# Patient Record
Sex: Female | Born: 1970 | Race: White | Hispanic: No | Marital: Married
Health system: Southern US, Community
[De-identification: ages and names within clinical notes are randomized; demographics above are authoritative.]

## PROBLEM LIST (undated history)

## (undated) DIAGNOSIS — E039 Hypothyroidism, unspecified: Secondary | ICD-10-CM

## (undated) DIAGNOSIS — I951 Orthostatic hypotension: Secondary | ICD-10-CM

## (undated) DIAGNOSIS — D649 Anemia, unspecified: Secondary | ICD-10-CM

## (undated) DIAGNOSIS — E669 Obesity, unspecified: Secondary | ICD-10-CM

## (undated) DIAGNOSIS — F603 Borderline personality disorder: Secondary | ICD-10-CM

## (undated) DIAGNOSIS — E7800 Pure hypercholesterolemia, unspecified: Secondary | ICD-10-CM

## (undated) DIAGNOSIS — I2510 Atherosclerotic heart disease of native coronary artery without angina pectoris: Secondary | ICD-10-CM

## (undated) DIAGNOSIS — F4310 Post-traumatic stress disorder, unspecified: Secondary | ICD-10-CM

## (undated) DIAGNOSIS — E785 Hyperlipidemia, unspecified: Secondary | ICD-10-CM

## (undated) DIAGNOSIS — Q2112 Patent foramen ovale: Secondary | ICD-10-CM

## (undated) DIAGNOSIS — I639 Cerebral infarction, unspecified: Secondary | ICD-10-CM

## (undated) DIAGNOSIS — I10 Essential (primary) hypertension: Secondary | ICD-10-CM

## (undated) DIAGNOSIS — I252 Old myocardial infarction: Secondary | ICD-10-CM

## (undated) DIAGNOSIS — Q211 Atrial septal defect: Secondary | ICD-10-CM

## (undated) DIAGNOSIS — I219 Acute myocardial infarction, unspecified: Secondary | ICD-10-CM

## (undated) DIAGNOSIS — M79605 Pain in left leg: Secondary | ICD-10-CM

## (undated) DIAGNOSIS — R200 Anesthesia of skin: Secondary | ICD-10-CM

## (undated) DIAGNOSIS — F332 Major depressive disorder, recurrent severe without psychotic features: Secondary | ICD-10-CM

## (undated) HISTORY — DX: Essential (primary) hypertension: I10

## (undated) HISTORY — PX: CARDIAC CATHETERIZATION: SHX172

## (undated) HISTORY — DX: Atrial septal defect: Q21.1

## (undated) HISTORY — DX: Anemia, unspecified: D64.9

## (undated) HISTORY — DX: Patent foramen ovale: Q21.12

## (undated) HISTORY — DX: Anesthesia of skin: R20.0

## (undated) HISTORY — DX: Cerebral infarction, unspecified: I63.9

## (undated) HISTORY — DX: Orthostatic hypotension: I95.1

## (undated) HISTORY — DX: Borderline personality disorder: F60.3

## (undated) HISTORY — PX: TONSILLECTOMY: SUR1361

## (undated) HISTORY — DX: Old myocardial infarction: I25.2

## (undated) HISTORY — DX: Atherosclerotic heart disease of native coronary artery without angina pectoris: I25.10

## (undated) HISTORY — PX: ANTERIOR CRUCIATE LIGAMENT REPAIR: SHX115

## (undated) HISTORY — DX: Pain in left leg: M79.605

## (undated) HISTORY — DX: Post-traumatic stress disorder, unspecified: F43.10

## (undated) HISTORY — DX: Acute myocardial infarction, unspecified: I21.9

## (undated) HISTORY — PX: ABDOMINAL HYSTERECTOMY: SHX81

## (undated) HISTORY — DX: Obesity, unspecified: E66.9

## (undated) HISTORY — PX: CORONARY ANGIOPLASTY WITH STENT PLACEMENT: SHX49

## (undated) HISTORY — DX: Hyperlipidemia, unspecified: E78.5

## (undated) HISTORY — DX: Hypothyroidism, unspecified: E03.9

## (undated) HISTORY — DX: Major depressive disorder, recurrent severe without psychotic features: F33.2

## (undated) HISTORY — PX: DILATION AND CURETTAGE OF UTERUS: SHX78

## (undated) HISTORY — DX: Pure hypercholesterolemia, unspecified: E78.00

---

## 2004-12-20 ENCOUNTER — Ambulatory Visit: Payer: Self-pay | Admitting: Cardiovascular Disease

## 2004-12-20 ENCOUNTER — Inpatient Hospital Stay (HOSPITAL_COMMUNITY): Admission: AD | Admit: 2004-12-20 | Discharge: 2004-12-22 | Payer: Self-pay | Admitting: Cardiology

## 2005-05-29 ENCOUNTER — Emergency Department (HOSPITAL_COMMUNITY): Admission: EM | Admit: 2005-05-29 | Discharge: 2005-05-30 | Payer: Self-pay | Admitting: Emergency Medicine

## 2008-11-08 ENCOUNTER — Emergency Department (HOSPITAL_COMMUNITY): Admission: EM | Admit: 2008-11-08 | Discharge: 2008-11-08 | Payer: Self-pay | Admitting: Emergency Medicine

## 2010-04-04 ENCOUNTER — Emergency Department (HOSPITAL_COMMUNITY): Admission: EM | Admit: 2010-04-04 | Discharge: 2010-04-04 | Payer: Self-pay | Admitting: Emergency Medicine

## 2015-07-22 DIAGNOSIS — I252 Old myocardial infarction: Secondary | ICD-10-CM

## 2015-07-22 DIAGNOSIS — I10 Essential (primary) hypertension: Secondary | ICD-10-CM | POA: Insufficient documentation

## 2015-07-22 DIAGNOSIS — I951 Orthostatic hypotension: Secondary | ICD-10-CM

## 2015-07-22 DIAGNOSIS — E785 Hyperlipidemia, unspecified: Secondary | ICD-10-CM

## 2015-07-22 HISTORY — DX: Old myocardial infarction: I25.2

## 2015-07-22 HISTORY — DX: Hyperlipidemia, unspecified: E78.5

## 2015-07-22 HISTORY — DX: Orthostatic hypotension: I95.1

## 2016-07-09 DIAGNOSIS — F4310 Post-traumatic stress disorder, unspecified: Secondary | ICD-10-CM

## 2016-07-09 DIAGNOSIS — F603 Borderline personality disorder: Secondary | ICD-10-CM | POA: Insufficient documentation

## 2016-07-09 HISTORY — DX: Post-traumatic stress disorder, unspecified: F43.10

## 2016-07-09 HISTORY — DX: Borderline personality disorder: F60.3

## 2018-01-22 DIAGNOSIS — I639 Cerebral infarction, unspecified: Secondary | ICD-10-CM | POA: Insufficient documentation

## 2018-01-31 DIAGNOSIS — I639 Cerebral infarction, unspecified: Secondary | ICD-10-CM

## 2018-01-31 HISTORY — DX: Cerebral infarction, unspecified: I63.9

## 2018-02-18 ENCOUNTER — Ambulatory Visit: Payer: Self-pay | Admitting: Occupational Therapy

## 2018-02-18 ENCOUNTER — Ambulatory Visit: Payer: Self-pay | Admitting: Physical Therapy

## 2018-03-02 ENCOUNTER — Ambulatory Visit: Payer: Medicaid Other | Attending: Physical Medicine and Rehabilitation | Admitting: Physical Therapy

## 2018-03-02 ENCOUNTER — Ambulatory Visit: Payer: Medicaid Other | Admitting: Occupational Therapy

## 2018-09-22 ENCOUNTER — Ambulatory Visit: Payer: Medicaid Other | Admitting: Neurology

## 2018-11-02 ENCOUNTER — Encounter: Payer: Self-pay | Admitting: Neurology

## 2018-11-02 ENCOUNTER — Ambulatory Visit: Payer: Medicaid Other | Admitting: Neurology

## 2018-11-02 VITALS — BP 118/80 | HR 70 | Ht 68.5 in | Wt 219.5 lb

## 2018-11-02 DIAGNOSIS — R200 Anesthesia of skin: Secondary | ICD-10-CM

## 2018-11-02 DIAGNOSIS — M79605 Pain in left leg: Secondary | ICD-10-CM

## 2018-11-02 HISTORY — DX: Pain in left leg: M79.605

## 2018-11-02 HISTORY — DX: Anesthesia of skin: R20.0

## 2018-11-02 MED ORDER — GABAPENTIN 100 MG PO CAPS: 100.0000 mg | ORAL_CAPSULE | Freq: Three times a day (TID) | ORAL | 6 refills | Status: DC

## 2018-11-02 NOTE — Progress Notes (Signed)
PATIENT: Jennifer Ali DOB: December 07, 1971  Chief Complaint  Patient presents with  . New Patient (Initial Visit)    New room, alone.  Paper referral from Dr. Clarene Duke for hx stroke. Pt had a stroke 6 months ago and still c/o post stroke phantom leg pains. She was seen at [facility] for stroke 01/2018. This was her first stroke. Having walking difficulty, using quad cane. States she has decreased function in left leg. Reprts about 2-3 falls in last year but no significant injuries.  She wears AFO brace (walk on reaction) on left foot.   Marland Kitchen PCP    Aida Puffer, MD  . Pain     HISTORICAL  Jennifer Ali is a 47 year old female, seen in request by primary care physician Dr. Aida Puffer for evaluation of stroke, initial evaluation was on November 02, 2018.  I have reviewed and summarized the referring note from the referring physician.  She reported a history of hypothyroidism, on supplement, hypertension, coronary artery disease in the past,  In February 2019, she woke up noticed left leg weakness, gradually getting worse over the next few days, to the point of difficulty walking, also clumsiness of her left hand,  She was treated at [facility] under the last name Jennifer, Ali, was diagnosed with stroke, this was followed by rehabilitation, and the left AFO, she came in today with fourfoot cane, wearing left AFO,  she continue complains of gait abnormality,  left low back pain, radiating pain to left lower extremity, frequent left leg muscle spasm, has tried over-the-counter Tylenol ibuprofen without helping,  I reviewed the history from [facility], MRI of the brain in February 2019: Acute right frontal parietal infarction, severe chronic microvascular ischemic changes with remote lacunar infarction.  Multiple foci of some semi confluent of restricted diffusion in the superior right frontal and parietal lobe with associated leptomeningeal enhancement and increased T2/FLAIR  signal. There is severe periventricular deep white matter increased T2-FLAIR signal consistent with chronic microvascular ischemic changes. Several old centrum semiovale lacunar infarct. Vessel wall imaging was performed and no vessel wall enhancement is noted. There are no extra-axial fluid collections present.  Intracranial MRA does not demonstrate any aneurysms or high grade stenoses.  Ultrasound of carotid artery: Left internal carotid artery less than 40% stenosis, right internal carotid artery was normal, subclavian's was normal, both vertebrals were patent with anterograde flow.   MRI of cervical spine: Degenerative disc disease results in severe left neural foraminal narrowing at C4-5 and mild canal stenosis at C5-6.  Laboratory evaluations hemoglobin 12.8, normal free T4 was within normal limits 1.25, A1c 5.3, triglyceride 227, LDL 52.  REVIEW OF SYSTEMS: Full 14 system review of systems performed and notable only for as above. All other review of systems were negative.  ALLERGIES: Allergies  Allergen Reactions  . Sulfa Antibiotics Hives    HOME MEDICATIONS: Current Outpatient Medications  Medication Sig Dispense Refill  . ASPIRIN 81 PO Take 1 tablet by mouth daily.    . chlorthalidone (HYGROTON) 25 MG tablet Take 1 tablet by mouth daily.  3  . levothyroxine (SYNTHROID, LEVOTHROID) 200 MCG tablet Take 1 tablet by mouth daily.  11  . metoprolol succinate (TOPROL-XL) 100 MG 24 hr tablet Take 1 tablet by mouth daily.  3   No current facility-administered medications for this visit.     PAST MEDICAL HISTORY: Past Medical History:  Diagnosis Date  . Heart attack (HCC)   . High cholesterol   . Hypertension   .  Stroke [facility]) 01/2018    PAST SURGICAL HISTORY: The histories are not reviewed yet. Please review them in the "History" navigator section and refresh this SmartLink.  FAMILY HISTORY: No family history on file.  SOCIAL HISTORY: Social History   Socioeconomic  History  . Marital status: Married    Spouse name: Not on file  . Number of children: Not on file  . Years of education: Not on file  . Highest education level: Not on file  Occupational History  . Not on file  Social Needs  . Financial resource strain: Not on file  . Food insecurity:    Worry: Not on file    Inability: Not on file  . Transportation needs:    Medical: Not on file    Non-medical: Not on file  Tobacco Use  . Smoking status: Not on file  Substance and Sexual Activity  . Alcohol use: Not on file  . Drug use: Not on file  . Sexual activity: Not on file  Lifestyle  . Physical activity:    Days per week: Not on file    Minutes per session: Not on file  . Stress: Not on file  Relationships  . Social connections:    Talks on phone: Not on file    Gets together: Not on file    Attends religious service: Not on file    Active member of club or organization: Not on file    Attends meetings of clubs or organizations: Not on file    Relationship status: Not on file  . Intimate partner violence:    Fear of current or ex partner: Not on file    Emotionally abused: Not on file    Physically abused: Not on file    Forced sexual activity: Not on file  Other Topics Concern  . Not on file  Social History Narrative   Lives with mother, father and fmaily   Caffeine use: soda/tea daily   Right handed     PHYSICAL EXAM   Vitals:   11/02/18 0742  BP: 118/80  Pulse: 70  Weight: 219 lb 8 oz (99.6 kg)  Height: 5' 8.5" (1.74 m)    Not recorded      Body mass index is 32.89 kg/m.  PHYSICAL EXAMNIATION:  Gen: NAD, conversant, well nourised, obese, well groomed                     Cardiovascular: Regular rate rhythm, no peripheral edema, warm, nontender. Eyes: Conjunctivae clear without exudates or hemorrhage Neck: Supple, no carotid bruits. Pulmonary: Clear to auscultation bilaterally   NEUROLOGICAL EXAM:  MENTAL STATUS: Speech:    Speech is normal;  fluent and spontaneous with normal comprehension.  Cognition:     Orientation to time, place and person     Normal recent and remote memory     Normal Attention span and concentration     Normal Language, naming, repeating,spontaneous speech     Fund of knowledge   CRANIAL NERVES: CN II: Visual fields are full to confrontation. Fundoscopic exam is normal with sharp discs and no vascular changes. Pupils are round equal and briskly reactive to light. CN III, IV, VI: extraocular movement are normal. No ptosis. CN V: Facial sensation is intact to pinprick in all 3 divisions bilaterally. Corneal responses are intact.  CN VII: Face is symmetric with normal eye closure and smile. CN VIII: Hearing is normal to rubbing fingers CN IX, X: Palate elevates symmetrically. Phonation  is normal. CN XI: Head turning and shoulder shrug are intact CN XII: Tongue is midline with normal movements and no atrophy.  MOTOR: She wears a left AFO, variable effort on examinations, there was no significant left ankle dorsiflexion weakness.  REFLEXES: Reflexes are 2+ and symmetric at the biceps, triceps, knees, and ankles. Plantar responses are flexor.  SENSORY: Intact to light touch, pinprick, positional sensation and vibratory sensation are intact in fingers and toes.  COORDINATION: Rapid alternating movements and fine finger movements are intact. There is no dysmetria on finger-to-nose and heel-knee-shin.    GAIT/STANCE: She needs pushed up to get up from seated position, variable effort,  DIAGNOSTIC DATA (LABS, IMAGING, TESTING) - I reviewed patient records, labs, notes, testing and imaging myself where available.   ASSESSMENT AND PLAN  Maryelizabeth Eberle is a 47 y.o. female   Stroke in young patient  No significant vascular risk factors, normal blood pressure today, normal A1c 5.3, LDL was 52  Complete stroke evaluation with echocardiogram  May consider hypercoagulable, inflammatory work-up  Left  leg pain, muscle spasm  EMG nerve conduction study to rule out peripheral etiology  Gabapentin 100 mg 3 times a day   Levert Feinstein, M.D. Ph.D.  [facility] [address], [address] [place], [place] [address] Ph: [phone] Fax: [phone]  [id number], Fayrene Fearing, MD

## 2018-11-28 ENCOUNTER — Emergency Department (HOSPITAL_COMMUNITY)
Admission: EM | Admit: 2018-11-28 | Discharge: 2018-11-28 | Disposition: A | Discharge status: Home / Self Care | Payer: Medicaid Other | Attending: Emergency Medicine | Admitting: Emergency Medicine

## 2018-11-28 ENCOUNTER — Encounter (HOSPITAL_COMMUNITY): Payer: Self-pay

## 2018-11-28 ENCOUNTER — Emergency Department (HOSPITAL_COMMUNITY): Payer: Medicaid Other

## 2018-11-28 DIAGNOSIS — I10 Essential (primary) hypertension: Secondary | ICD-10-CM | POA: Diagnosis not present

## 2018-11-28 DIAGNOSIS — M62830 Muscle spasm of back: Secondary | ICD-10-CM | POA: Diagnosis not present

## 2018-11-28 DIAGNOSIS — Z79899 Other long term (current) drug therapy: Secondary | ICD-10-CM | POA: Insufficient documentation

## 2018-11-28 DIAGNOSIS — Z8673 Personal history of transient ischemic attack (TIA), and cerebral infarction without residual deficits: Secondary | ICD-10-CM | POA: Insufficient documentation

## 2018-11-28 DIAGNOSIS — M546 Pain in thoracic spine: Secondary | ICD-10-CM | POA: Insufficient documentation

## 2018-11-28 DIAGNOSIS — I252 Old myocardial infarction: Secondary | ICD-10-CM | POA: Diagnosis not present

## 2018-11-28 DIAGNOSIS — M7918 Myalgia, other site: Secondary | ICD-10-CM

## 2018-11-28 LAB — BASIC METABOLIC PANEL
Anion gap: 14 (ref 5–15)
BUN: 11 mg/dL (ref 6–20)
CO2: 25 mmol/L (ref 22–32)
CREATININE: 0.76 mg/dL (ref 0.44–1.00)
Calcium: 9.8 mg/dL (ref 8.9–10.3)
Chloride: 99 mmol/L (ref 98–111)
GFR calc Af Amer: >60 mL/min (ref >60)
GFR calc non Af Amer: >60 mL/min (ref >60)
Glucose, Bld: 137 mg/dL — ABNORMAL HIGH (ref 70–99)
Potassium: 3.6 mmol/L (ref 3.5–5.1)
Sodium: 138 mmol/L (ref 135–145)

## 2018-11-28 LAB — I-STAT TROPONIN, ED: Troponin i, poc: 0 ng/mL (ref 0.00–0.08)

## 2018-11-28 LAB — CBC WITH DIFFERENTIAL/PLATELET
Abs Immature Granulocytes: 0.04 10*3/uL (ref 0.00–0.07)
Basophils Absolute: 0 10*3/uL (ref 0.0–0.1)
Basophils Relative: 0 %
Eosinophils Absolute: 0.2 10*3/uL (ref 0.0–0.5)
Eosinophils Relative: 2 %
HCT: 44.4 % (ref 36.0–46.0)
Hemoglobin: 14.6 g/dL (ref 12.0–15.0)
Immature Granulocytes: 0 %
Lymphocytes Relative: 18 %
Lymphs Abs: 1.7 10*3/uL (ref 0.7–4.0)
MCH: 32.4 pg (ref 26.0–34.0)
MCHC: 32.9 g/dL (ref 30.0–36.0)
MCV: 98.7 fL (ref 80.0–100.0)
Monocytes Absolute: 0.4 10*3/uL (ref 0.1–1.0)
Monocytes Relative: 5 %
Neutro Abs: 7.1 10*3/uL (ref 1.7–7.7)
Neutrophils Relative %: 75 %
Platelets: 331 10*3/uL (ref 150–400)
RBC: 4.5 MIL/uL (ref 3.87–5.11)
RDW: 13.8 % (ref 11.5–15.5)
WBC: 9.5 10*3/uL (ref 4.0–10.5)
nRBC: 0 % (ref 0.0–0.2)

## 2018-11-28 NOTE — ED Provider Notes (Signed)
[facility] EMERGENCY DEPARTMENT Provider Note   CSN: [id number] Arrival date & time: 11/28/18  1806     History   Chief Complaint No chief complaint on file.   HPI Jennifer Ali is a 47 y.o. female.  47yo female with prior history of stroke and MI presents with complaint of pain in her left upper back x3 days.  Patient states pain started Thursday night while she was doing the dishes, has been constant since that time, is worse with movement and taking a deep breath, does not radiate, also reports discomfort in her left deltoid area, worse with movement of her left shoulder.  Patient denies shortness of breath, chest pain, nausea or vomiting, diaphoresis, weakness or change in sensation.  Patient states that she had a stroke in February of this year and reports she had similar pain in her back for 1 month leading up to her stroke and is concerned she is having another stroke.  No other complaints or concerns.     Past Medical History:  Diagnosis Date  . Heart attack (HCC)   . High cholesterol   . Hypertension   . Stroke [facility]) 01/2018    Patient Active Problem List   Diagnosis Date Noted  . Left leg pain 11/02/2018  . Left leg numbness 11/02/2018    History reviewed. No pertinent surgical history.   OB History   None      Home Medications    Prior to Admission medications   Medication Sig Start Date End Date Taking? Authorizing Provider  ASPIRIN 81 PO Take 1 tablet by mouth daily.    [provider]  chlorthalidone (HYGROTON) 25 MG tablet Take 1 tablet by mouth daily. 10/23/18   [provider]  gabapentin (NEURONTIN) 100 MG capsule Take 1 capsule (100 mg total) by mouth 3 (three) times daily. 11/02/18   Levert Feinstein, MD  levothyroxine (SYNTHROID, LEVOTHROID) 200 MCG tablet Take 1 tablet by mouth daily. 10/23/18   [provider]  metoprolol succinate (TOPROL-XL) 100 MG 24 hr tablet Take 1 tablet by mouth daily.  09/23/18   [provider]    Family History History reviewed. No pertinent family history.  Social History Social History   Tobacco Use  . Smoking status: Never Smoker  . Smokeless tobacco: Never Used  Substance Use Topics  . Alcohol use: Not on file  . Drug use: Not on file     Allergies   Sulfa antibiotics   Review of Systems Review of Systems  Constitutional: Negative for chills, diaphoresis, fatigue and fever.  Eyes: Negative for visual disturbance.  Respiratory: Negative for chest tightness and shortness of breath.   Cardiovascular: Negative for chest pain.  Gastrointestinal: Negative for abdominal pain, nausea and vomiting.  Musculoskeletal: Positive for back pain and myalgias. Negative for arthralgias, gait problem and joint swelling.  Skin: Negative for rash and wound.  Allergic/Immunologic: Negative for immunocompromised state.  Neurological: Negative for dizziness, facial asymmetry, speech difficulty, weakness, numbness and headaches.  Hematological: Does not bruise/bleed easily.  Psychiatric/Behavioral: Negative for confusion.  All other systems reviewed and are negative.    Physical Exam Updated Vital Signs BP (!) 129/97 (BP Location: Right Arm)   Pulse 86   Temp 98.3 F (36.8 C)   Resp 16   Ht 5\' 8"  (1.727 m)   Wt 95.3 kg   SpO2 98%   BMI 31.93 kg/m   Physical Exam  Constitutional: She is oriented to person, place, and time.  She appears well-developed and well-nourished. No distress.  HENT:  Head: Normocephalic and atraumatic.  Nose: Nose normal.  Mouth/Throat: Oropharynx is clear and moist. No oropharyngeal exudate.  Eyes: Pupils are equal, round, and reactive to light. EOM are normal.  Neck: Neck supple.  Cardiovascular: Normal rate, regular rhythm, normal heart sounds and intact distal pulses.  No murmur heard. Pulmonary/Chest: Effort normal and breath sounds normal. No respiratory distress.  Abdominal: Soft. She exhibits no  distension. There is no tenderness.  Musculoskeletal: She exhibits tenderness. She exhibits no deformity.       Cervical back: She exhibits no bony tenderness.       Thoracic back: She exhibits no bony tenderness.       Back:  And has palpation along the medial border of the left scapula with palpable muscle spasm, palpation clearly reproduces patient's pain.  There is no midline or bony tenderness.  There is no tenderness in the neck.  Mild tenderness to the left trapezius area and lateral left shoulder at the deltoid.  Neurological: She is alert and oriented to person, place, and time. She has normal strength and normal reflexes. She is not disoriented. She displays normal reflexes. No cranial nerve deficit or sensory deficit. GCS eye subscore is 4. GCS verbal subscore is 5. GCS motor subscore is 6. She displays no Babinski's sign on the right side. She displays no Babinski's sign on the left side.  Skin: Skin is warm and dry. No rash noted. She is not diaphoretic. No erythema.  Psychiatric: She has a normal mood and affect. Her behavior is normal.  Nursing note and vitals reviewed.    ED Treatments / Results  Labs (all labs ordered are listed, but only abnormal results are displayed) Labs Reviewed  BASIC METABOLIC PANEL - Abnormal; Notable for the following components:      Result Value   Glucose, Bld 137 (*)    All other components within normal limits  CBC WITH DIFFERENTIAL/PLATELET  I-STAT TROPONIN, ED    EKG EKG Interpretation  Date/Time:  Sunday November 28 2018 18:35:20 EST Ventricular Rate:  105 PR Interval:  144 QRS Duration: 94 QT Interval:  338 QTC Calculation: 446 R Axis:   72 Text Interpretation:  Sinus tachycardia Otherwise normal ECG No old tracing to compare Confirmed by Linwood Dibbles [phone]) on 11/28/2018 7:00:19 PM   Radiology Mr Brain Wo Contrast  Result Date: 11/28/2018 CLINICAL DATA:  47 y/o F; left shoulder and arm pain that started Thursday. History of  stroke in February. EXAM: MRI HEAD WITHOUT CONTRAST TECHNIQUE: Multiplanar, multiecho pulse sequences of the brain and surrounding structures were obtained without intravenous contrast. COMPARISON:  None. FINDINGS: Brain: No acute infarction, hemorrhage, hydrocephalus, extra-axial collection or mass lesion. Small chronic infarct of the right superior frontoparietal junction with mild wallerian degeneration extending into the right posterior limb of internal capsule. Several nonspecific T2 FLAIR hyperintensities in subcortical and periventricular white matter are advanced for age. Mild volume loss of the brain. No lesion is identified within the corpus callosum, basal ganglia, or the posterior fossa. Vascular: Normal flow voids. Skull and upper cervical spine: Normal marrow signal. Sinuses/Orbits: Partial opacification of left mastoid tip. No other abnormal signal of paranasal sinuses or mastoid air cells. Other: None. IMPRESSION: 1. No acute intracranial abnormality identified. 2. Small chronic infarct of the right superior frontoparietal junction. 3. Nonspecific white matter hyperintensities and volume loss of the brain unexpected for age. Some differential considerations include age advanced microvascular ischemic changes, vasculopathy,  demyelination, or sequelae of an infectious/inflammatory process. Electronically Signed   By: Mitzi Hansen M.D.   On: 11/28/2018 21:47   Dg Chest Port 1 View  Result Date: 11/28/2018 CLINICAL DATA:  Left shoulder pain EXAM: PORTABLE CHEST 1 VIEW COMPARISON:  None. FINDINGS: Lungs are clear.  No pleural effusion or pneumothorax. The heart is normal in size.  Coronary stent. IMPRESSION: No evidence of acute cardiopulmonary disease. Electronically Signed   By: Charline Bills M.D.   On: 11/28/2018 19:32    Procedures Procedures (including critical care time)  Medications Ordered in ED Medications - No data to display   Initial Impression / Assessment and  Plan / ED Course  I have reviewed the triage vital signs and the nursing notes.  Pertinent labs & imaging results that were available during my care of the patient were reviewed by me and considered in my medical decision making (see chart for details).  Clinical Course as of Nov 28 2202  Wynelle Link Nov 28, 2018  5868 47 year old female with history of stroke and MI presents with complaint of pain in her left upper back along the medial border the scapula and into her left shoulder.  Patient states that she had this exact pain for 1 month prior to her stroke and is concerned that she is having a stroke tonight.  Patient denies any new weakness or changes in sensation.  Patient denies changes in vision, speech, gait.  On exam patient has pain along the medial border of her left scapula which is reproduced with palpation, her exam is otherwise unremarkable.  Discussed with Dr. Lynelle Doctor, ER attending plan is for MRI brain.  MRI brain does not show any acute changes.  EKG is unremarkable, troponin x1 is negative, BMP and CBC are within normal notes.  Those results with patient who is relieved and reassured and will follow up with her primary care provider.   [LM]    Clinical Course User Index [LM] Jeannie Fend, PA-C   Final Clinical Impressions(s) / ED Diagnoses   Final diagnoses:  Musculoskeletal pain    ED Discharge Orders    None       Alden Hipp 11/28/18 2203    Linwood Dibbles, MD 11/30/18 1106

## 2018-11-28 NOTE — Discharge Instructions (Addendum)
Your evaluation today including MRI of your brain is reassuring, there is no evidence of an acute stroke at this time.  Apply over-the-counter lidocaine patches to your back for pain. Warm compresses to back for 20 meds at a time. Follow-up with your primary care provider for recheck.  Return to ER for new or worsening symptoms.

## 2018-11-28 NOTE — ED Triage Notes (Addendum)
Pt. Reports left shoulder and arm pain that started Thursday. Pt. Does not report injury to shoulder. Pt. Reports stroke in February with similar symptoms. Pt. Reports less sensation on left side from prior stroke.  A/O X4

## 2018-11-28 NOTE — ED Notes (Addendum)
Pt. Ambulated to bathroom without help, with steady gait

## 2018-11-28 NOTE — ED Notes (Signed)
Patient verbalizes understanding of discharge instructions. Opportunity for questioning and answers were provided. Armband removed by staff, pt discharged from ED ambulatory.   

## 2018-12-03 ENCOUNTER — Encounter: Payer: Medicaid Other | Admitting: Neurology

## 2019-01-23 NOTE — Progress Notes (Signed)
Cardiology Office Note:    Date:  01/25/2019   ID:  Jennifer Ali, DOB [birth year], MRN [id number]  PCP:  Tamsen Roers, MD  Cardiologist:  Shirlee More, MD   Referring MD: Tamsen Roers, MD  ASSESSMENT:    1. CAD in native artery   2. Essential hypertension   3. Ischemic stroke ([place])   4. Old MI (myocardial infarction)   5. Dyslipidemia    PLAN:    In order of problems listed above:  1. Stable at this time I do not think she requires an ischemia evaluation should continue current treatment but also start lipid-lowering therapy as a matter loss Y woman with CAD and stroke he has not receiving it and will start with a high intensity statin. 2. Stable blood pressure target continue current treatment recheck renal function potassium 3. For further evaluation 2-week ZIO monitor to screen for atrial fibrillation and echocardiogram with bubble to screen for cardiac source of embolism 4. See discussion under CAD 5. Initiate lipid-lowering therapy with stroke and heart attack  Next appointment   Medication Adjustments/Labs and Tests Ordered: Current medicines are reviewed at length with the patient today.  Concerns regarding medicines are outlined above.  Orders Placed This Encounter  Procedures  . Lipid Profile  . Lipoprotein A (LPA)  . Comp Met (CMET)  . LONG TERM MONITOR (3-14 DAYS)  . EKG 12-Lead  . ECHOCARDIOGRAM LIMITED BUBBLE STUDY   Meds ordered this encounter  Medications  . rosuvastatin (CRESTOR) 10 MG tablet    Sig: Take 1 tablet (10 mg total) by mouth daily.    Dispense:  30 tablet    Refill:  3     Chief Complaint  Patient presents with  . Establish Care    recent stroke  . Coronary Artery Disease    History of Present Illness:    Jennifer Ali is a 48 y.o. female who is being seen today for establishing cardiology care at the request of Little, Jeneen Rinks, MD.  She had a history of recent stroke 01/28/2018 her right frontal parietal  area on MRI outside of the window for intervention.  Carotid duplex showed a 40% stenosis of the left internal carotid artery.  She is treated medically with aspirin an echocardiogram was ordered but does not appear to have been performed and she was monitored in hospital and no arrhythmia was documented.  She has a background history of recent methamphetamine usage and a history of coronary artery disease.  And lipid profile showed a cholesterol 130 HDL 33 LDL of 52 triglyceride 227 unknown whether is fasting or not BMP was normal TSH was markedly elevated at 36.8 and CBC showed a hemoglobin of 12.8 macrocytic indices.  She was subsequently seen in November by neurology for stroke echocardiogram ordered she did not have it performed she presented to the emergency room [facility] 11/28/2018 with back pain it was felt to be non-cardiovascular in etiology repeat MRI of the brain did not show acute ischemic changes EKG showed sinus tachycardia otherwise normal troponin BMP and CBC were all noted is normal and was discharged from the emergency room with a final diagnosis of musculoskeletal pain.  I independently reviewed that EKG and agree with the exception of sinus tachycardia and is normal.    she has a history in 2005-2006 of myocardial infarction PCI and stent. I had seen her in the past. She has no chest pain palpitation shortness of breath or syncope and no known history  of congenital heart disease or atrial fibrillation.  During her stay with stroke at [facility] she was predominantly in a rehab center not monitored.  Echocardiogram is been discussed on several occasions but never performed.  For further evaluation we will do a lipid profile and lipoprotein a level in 1 month after starting a high intensity statin 2-week ambulatory monitor looking for subclinical atrial fibrillation and echocardiogram with bubble contrast and if abnormal would require transesophageal echocardiogram.  Should  continue her current lipid-lowering therapy.  At this time I do not think she requires an ischemia evaluation and her hypertension is stable and continue current treatment   Past Medical History:  Diagnosis Date  . Anemia   . Dyslipidemia 07/22/2015   Overview:  Low HDL  . Heart attack ([place])   . High cholesterol   . Hypertension   . Hypothyroidism   . Ischemic stroke ([place]) 01/31/2018  . Left leg numbness 11/02/2018  . Left leg pain 11/02/2018  . Obesity   . 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 s/p MI Dec. 2005. S/P PCI and CYpher stent LAD  2. No sig. disease by cath, June,2010  3.Stress echo negative for ischemia at 10 mets in June 2013  . Orthostatic hypotension 07/22/2015  . Stroke [facility]) 01/2018    Past Surgical History:  Procedure Laterality Date  . ABDOMINAL HYSTERECTOMY    . ANTERIOR CRUCIATE LIGAMENT REPAIR    . CARDIAC CATHETERIZATION    . CORONARY ANGIOPLASTY WITH STENT PLACEMENT    . DILATION AND CURETTAGE OF UTERUS    . TONSILLECTOMY      Current Medications: Current Meds  Medication Sig  . ASPIRIN 81 PO Take 1 tablet by mouth daily.  . chlorthalidone (HYGROTON) 25 MG tablet Take 1 tablet by mouth daily.  Marland Kitchen gabapentin (NEURONTIN) 100 MG capsule Take 1 capsule (100 mg total) by mouth 3 (three) times daily.  Marland Kitchen levothyroxine (SYNTHROID, LEVOTHROID) 200 MCG tablet Take 1 tablet by mouth daily.  . metoprolol succinate (TOPROL-XL) 100 MG 24 hr tablet Take 1 tablet by mouth daily.     Allergies:   Sulfa antibiotics   Social History   Socioeconomic History  . Marital status: Married    Spouse name: Not on file  . Number of children: Not on file  . Years of education: Not on file  . Highest education level: Not on file  Occupational History  . Not on file  Social Needs  . Financial resource strain: Not on file  . Food insecurity:    Worry: Not on file    Inability: Not on file  . Transportation needs:    Medical: Not on file     Non-medical: Not on file  Tobacco Use  . Smoking status: Former Smoker    Packs/day: 0.50    Types: Cigarettes  . Smokeless tobacco: Never Used  Substance and Sexual Activity  . Alcohol use: Not Currently    Comment: occassionally  . Drug use: Not Currently    Types: Marijuana, Methamphetamines, Cocaine  . Sexual activity: Not on file  Lifestyle  . Physical activity:    Days per week: Not on file    Minutes per session: Not on file  . Stress: Not on file  Relationships  . Social connections:    Talks on phone: Not on file    Gets together: Not on file    Attends religious service: Not on file    Active member of  club or organization: Not on file    Attends meetings of clubs or organizations: Not on file    Relationship status: Not on file  Other Topics Concern  . Not on file  Social History Narrative   Lives with mother, father and fmaily   Caffeine use: soda/tea daily   Right handed     Family History: The patient's family history includes Diabetes in her father and mother; Hyperlipidemia in her mother; Hypertension in her mother; Stroke in her mother.  ROS:   Review of Systems  Constitution: Positive for malaise/fatigue.  HENT: Negative.   Eyes: Negative.   Cardiovascular: Negative.   Respiratory: Negative.   Endocrine: Negative.   Hematologic/Lymphatic: Negative.   Skin: Negative.   Musculoskeletal: Positive for muscle weakness (left leg).  Gastrointestinal: Negative.   Genitourinary: Negative.   Neurological: Positive for focal weakness.  Psychiatric/Behavioral: Negative.   Allergic/Immunologic: Negative.    Please see the history of present illness.     All other systems reviewed and are negative.  EKGs/Labs/Other Studies Reviewed:    The following studies were reviewed today: I reviewed her records both neurology consultation after and her records from inpatient with stroke prior to the visit  EKG:  EKG is  ordered today.  The ekg ordered today  demonstrates [place] normal  Recent Labs: 11/28/2018: BUN 11; Creatinine, Ser 0.76; Hemoglobin 14.6; Platelets 331; Potassium 3.6; Sodium 138  Recent Lipid Panel No results found for: CHOL, TRIG, HDL, CHOLHDL, VLDL, LDLCALC, LDLDIRECT  Physical Exam:    VS:  BP 110/80 (BP Location: Left Arm, Patient Position: Sitting, Cuff Size: Large)   Pulse 78   Ht 5' 8.5" (1.74 m)   Wt 223 lb 6 oz (101.3 kg)   SpO2 96%   BMI 33.47 kg/m     Wt Readings from Last 3 Encounters:  01/25/19 223 lb 6 oz (101.3 kg)  11/28/18 210 lb (95.3 kg)  11/02/18 219 lb 8 oz (99.6 kg)     GEN:  Well nourished, well developed in no acute distress HEENT: Normal NECK: No JVD; No carotid bruits LYMPHATICS: No lymphadenopathy CARDIAC: RRR, no murmurs, rubs, gallops RESPIRATORY:  Clear to auscultation without rales, wheezing or rhonchi  ABDOMEN: Soft, non-tender, non-distended MUSCULOSKELETAL:  No edema; No deformity  SKIN: Warm and dry NEUROLOGIC:  Alert and oriented x 3 PSYCHIATRIC:  Normal affect     Signed, Shirlee More, MD  01/25/2019 12:52 PM    [facility]

## 2019-01-25 ENCOUNTER — Other Ambulatory Visit: Payer: Self-pay | Admitting: Cardiology

## 2019-01-25 ENCOUNTER — Encounter: Payer: Self-pay | Admitting: Cardiology

## 2019-01-25 ENCOUNTER — Ambulatory Visit (INDEPENDENT_AMBULATORY_CARE_PROVIDER_SITE_OTHER): Payer: Medicaid Other | Admitting: Cardiology

## 2019-01-25 ENCOUNTER — Encounter: Payer: Self-pay | Admitting: *Deleted

## 2019-01-25 VITALS — BP 110/80 | HR 78 | Ht 68.5 in | Wt 223.4 lb

## 2019-01-25 DIAGNOSIS — I10 Essential (primary) hypertension: Secondary | ICD-10-CM

## 2019-01-25 DIAGNOSIS — I639 Cerebral infarction, unspecified: Secondary | ICD-10-CM

## 2019-01-25 DIAGNOSIS — F332 Major depressive disorder, recurrent severe without psychotic features: Secondary | ICD-10-CM

## 2019-01-25 DIAGNOSIS — I2510 Atherosclerotic heart disease of native coronary artery without angina pectoris: Secondary | ICD-10-CM

## 2019-01-25 DIAGNOSIS — I252 Old myocardial infarction: Secondary | ICD-10-CM

## 2019-01-25 DIAGNOSIS — E785 Hyperlipidemia, unspecified: Secondary | ICD-10-CM

## 2019-01-25 DIAGNOSIS — R000 Tachycardia, unspecified: Secondary | ICD-10-CM

## 2019-01-25 HISTORY — DX: Major depressive disorder, recurrent severe without psychotic features: F33.2

## 2019-01-25 MED ORDER — ROSUVASTATIN CALCIUM 10 MG PO TABS: 10.0000 mg | ORAL_TABLET | Freq: Every day | ORAL | 3 refills | Status: DC

## 2019-01-25 NOTE — Patient Instructions (Signed)
Medication Instructions:  Your physician has recommended you make the following change in your medication:    START:  rosuvastatin (Crestor) 10mg  one tablet daily.   If you need a refill on your cardiac medications before your next appointment, please call your pharmacy.   Lab work: You will have lab work in 1 month.  CMP, Lipids, and Lpa  If you have labs (blood work) drawn today and your tests are completely normal, you will receive your results only by: Marland Kitchen MyChart Message (if you have MyChart) OR . A paper copy in the mail If you have any lab test that is abnormal or we need to change your treatment, we will call you to review the results.  Testing/Procedures: You had an EKG today  Your physician has recommended that you wear a holter monitor. Holter monitors are medical devices that record the heart's electrical activity. Doctors most often use these monitors to diagnose arrhythmias. Arrhythmias are problems with the speed or rhythm of the heartbeat. The monitor is a small, portable device. You can wear one while you do your normal daily activities. This is usually used to diagnose what is causing palpitations/syncope (passing out).  Your physician has requested that you have an echocardiogram. Echocardiography is a painless test that uses sound waves to create images of your heart. It provides your doctor with information about the size and shape of your heart and how well your heart's chambers and valves are working. This procedure takes approximately one hour. There are no restrictions for this procedure.    Follow-Up: At [facility], you and your health needs are our priority.  As part of our continuing mission to provide you with exceptional heart care, we have created designated Provider Care Teams.  These Care Teams include your primary Cardiologist (physician) and Advanced Practice Providers (APPs -  Physician Assistants and Nurse Practitioners) who all work together to  provide you with the care you need, when you need it. You will need a follow up appointment in 3 months.  Please call our office 2 months in advance to schedule this appointment.

## 2019-02-01 ENCOUNTER — Ambulatory Visit (INDEPENDENT_AMBULATORY_CARE_PROVIDER_SITE_OTHER): Payer: Medicaid Other

## 2019-02-01 DIAGNOSIS — R000 Tachycardia, unspecified: Secondary | ICD-10-CM | POA: Diagnosis not present

## 2019-02-01 DIAGNOSIS — I639 Cerebral infarction, unspecified: Secondary | ICD-10-CM

## 2019-02-09 ENCOUNTER — Telehealth: Payer: Self-pay | Admitting: Neurology

## 2019-02-09 NOTE — Telephone Encounter (Signed)
Pt called back to r/s a NCV/EMG that she had cancelled before but she is also wanting to know if she can get advise on what to do for her memory loss that she has been experiencing in the last month due to her stroke. Please advise.

## 2019-02-10 NOTE — Telephone Encounter (Signed)
Returned call to the patient.  She has rescheduled her NCV/EMG to 03/14/2019.  She would like to come back in for a visit to have her worsening memory evaluated.  She has been scheduled for an office visit on 03/21/2019.

## 2019-02-21 ENCOUNTER — Other Ambulatory Visit: Payer: Medicaid Other

## 2019-03-11 ENCOUNTER — Telehealth: Payer: Self-pay | Admitting: *Deleted

## 2019-03-11 NOTE — Telephone Encounter (Signed)
Called and spoke with pt. Advised 03/14/19 emg/ncs appt cx d/t concerns with coronavirus. We will f/u on Monday about getting her appt r/s. She verbalized understanding and appreciation for call.

## 2019-03-14 ENCOUNTER — Encounter: Payer: Self-pay | Admitting: Neurology

## 2019-03-14 ENCOUNTER — Other Ambulatory Visit: Payer: Medicaid Other

## 2019-03-18 ENCOUNTER — Encounter: Payer: Self-pay | Admitting: Neurology

## 2019-03-18 ENCOUNTER — Other Ambulatory Visit: Payer: Self-pay

## 2019-03-18 ENCOUNTER — Ambulatory Visit (INDEPENDENT_AMBULATORY_CARE_PROVIDER_SITE_OTHER): Payer: Medicaid Other | Admitting: Neurology

## 2019-03-18 DIAGNOSIS — M79605 Pain in left leg: Secondary | ICD-10-CM | POA: Diagnosis not present

## 2019-03-18 DIAGNOSIS — I639 Cerebral infarction, unspecified: Secondary | ICD-10-CM | POA: Diagnosis not present

## 2019-03-18 DIAGNOSIS — R413 Other amnesia: Secondary | ICD-10-CM | POA: Diagnosis not present

## 2019-03-18 MED ORDER — GABAPENTIN 300 MG PO CAPS: 300.0000 mg | ORAL_CAPSULE | Freq: Three times a day (TID) | ORAL | 11 refills | Status: DC

## 2019-03-18 NOTE — Progress Notes (Signed)
PATIENT: Jennifer Ali DOB: [birth year]  No chief complaint on file.    HISTORICAL  Jennifer Ali is a 48 year old female, seen in request by primary care physician Dr. Aida Puffer for evaluation of stroke, initial evaluation was on November 02, 2018.  I have reviewed and summarized the referring note from the referring physician.  She reported a history of hypothyroidism, on supplement, hypertension, coronary artery disease in the past,  In February 2019, she woke up noticed left leg weakness, gradually getting worse over the next few days, to the point of difficulty walking, also clumsiness of her left hand,  She was treated at [facility] under the last name R[place], [place], was diagnosed with stroke, this was followed by rehabilitation, and the left AFO, she came in today with fourfoot cane, wearing left AFO,  she continue complains of gait abnormality,  left low back pain, radiating pain to left lower extremity, frequent left leg muscle spasm, has tried over-the-counter Tylenol ibuprofen without helping,  I reviewed the history from [facility], MRI of the brain in February 2019: Acute right frontal parietal infarction, severe chronic microvascular ischemic changes with remote lacunar infarction.  Multiple foci of some semi confluent of restricted diffusion in the superior right frontal and parietal lobe with associated leptomeningeal enhancement and increased T2/FLAIR signal. There is severe periventricular deep white matter increased T2-FLAIR signal consistent with chronic microvascular ischemic changes. Several old centrum semiovale lacunar infarct. Vessel wall imaging was performed and no vessel wall enhancement is noted. There are no extra-axial fluid collections present.  Intracranial MRA does not demonstrate any aneurysms or high grade stenoses.  Ultrasound of carotid artery: Left internal carotid artery less than 40% stenosis, right internal carotid  artery was normal, subclavian's was normal, both vertebrals were patent with anterograde flow.   MRI of cervical spine: Degenerative disc disease results in severe left neural foraminal narrowing at C4-5 and mild canal stenosis at C5-6.  Laboratory evaluations hemoglobin 12.8, normal free T4 was within normal limits 1.25, A1c 5.3, triglyceride 227, LDL 52.  Virtual Visit via Video  I connected with Jennifer Ali on 03/18/19 at  by Video and verified that I am speaking with the correct person using two identifiers.   I discussed the limitations, risks, security and privacy concerns of performing an evaluation and management service by Video and the availability of in person appointments. I also discussed with the patient that there may be a patient responsible charge related to this service. The patient expressed understanding and agreed to proceed.   History of Present Illness: Her scheduled ECHO, EMG/NCS was cancelled. She still has left leg difficulty, frequent left leg cramping, also complains of short-term memory loss.  She is taking 100 mg 3 times a day, which was helpful,   Observations/Objective: I have reviewed problem lists, medications, allergies.  I personally reviewed MRI of the brain in December 2019, no acute abnormality, small chronic infarction of the right superior frontal parietal junction, mild generalized atrophy, supratentorium small vessel disease.   Assessment and Plan: Stroke in young patient  No significant vascular risk factors, normal blood pressure today, normal A1c 5.3, LDL was 52  Complete stroke evaluation with echocardiogram-pending  MRI brain showed generalized atrophy, supratentorium small vessel disease, unexpected for age, small chronic infarction of the right superior frontal parietal junction, consideration including advanced microvascular ischemic changes, vasculopathy, demyelination, Short-term memory loss  Laboratory evaluations, B12,  TSH, also inflammatory markers  Left leg pain, muscle  spasm  EMG nerve conduction study pending  Higher dose of gabapentin 300 mg 3 times daily   Follow Up Instructions:  In 2-3 months    I discussed the assessment and treatment plan with the patient. The patient was provided an opportunity to ask questions and all were answered. The patient agreed with the plan and demonstrated an understanding of the instructions.   The patient was advised to call back or seek an in-person evaluation if the symptoms worsen or if the condition fails to improve as anticipated.  I provided 25 minutes of non-face-to-face time during this encounter.   Levert Feinstein, MD

## 2019-03-21 ENCOUNTER — Ambulatory Visit: Payer: Self-pay | Admitting: Neurology

## 2019-06-01 ENCOUNTER — Other Ambulatory Visit: Payer: Self-pay

## 2019-06-01 ENCOUNTER — Telehealth: Payer: Self-pay | Admitting: Neurology

## 2019-06-01 ENCOUNTER — Ambulatory Visit (INDEPENDENT_AMBULATORY_CARE_PROVIDER_SITE_OTHER): Payer: Medicaid Other | Admitting: Neurology

## 2019-06-01 DIAGNOSIS — I639 Cerebral infarction, unspecified: Secondary | ICD-10-CM | POA: Diagnosis not present

## 2019-06-01 DIAGNOSIS — R269 Unspecified abnormalities of gait and mobility: Secondary | ICD-10-CM | POA: Diagnosis not present

## 2019-06-01 DIAGNOSIS — M79605 Pain in left leg: Secondary | ICD-10-CM

## 2019-06-01 DIAGNOSIS — R200 Anesthesia of skin: Secondary | ICD-10-CM

## 2019-06-01 NOTE — Progress Notes (Signed)
PATIENT: Jennifer Ali DOB: [birth year]  HISTORICAL  Jennifer Ali is a 48 year old female, seen in request by primary care physician Dr. Aida Puffer for evaluation of stroke, initial evaluation was on November 02, 2018.  I have reviewed and summarized the referring note from the referring physician.  She reported a history of hypothyroidism, on supplement, hypertension, coronary artery disease in the past,  In February 2019, she woke up noticed left leg weakness, gradually getting worse over the next few days, to the point of difficulty walking, also clumsiness of her left hand,  She was treated at [facility] under the last name Jennifer Ali, Jennifer Ali, was diagnosed with stroke, this was followed by rehabilitation, and the left AFO, she came in today with fourfoot cane, wearing left AFO,  she continue complains of gait abnormality,  left low back pain, radiating pain to left lower extremity, frequent left leg muscle spasm, has tried over-the-counter Tylenol ibuprofen without helping,  I reviewed the history from [facility], MRI of the brain in February 2019: Acute right frontal parietal infarction, severe chronic microvascular ischemic changes with remote lacunar infarction.  Multiple foci of some semi confluent of restricted diffusion in the superior right frontal and parietal lobe with associated leptomeningeal enhancement and increased T2/FLAIR signal. There is severe periventricular deep white matter increased T2-FLAIR signal consistent with chronic microvascular ischemic changes. Several old centrum semiovale lacunar infarct. Vessel wall imaging was performed and no vessel wall enhancement is noted. There are no extra-axial fluid collections present.  Intracranial MRA does not demonstrate any aneurysms or high grade stenoses.  Ultrasound of carotid artery: Left internal carotid artery less than 40% stenosis, right internal carotid artery was normal, subclavian's was  normal, both vertebrals were patent with anterograde flow.   MRI of cervical spine: Degenerative disc disease results in severe left neural foraminal narrowing at C4-5 and mild canal stenosis at C5-6.  Laboratory evaluations hemoglobin 12.8, normal free T4 was within normal limits 1.25, A1c 5.3, triglyceride 227, LDL 52.  UPDATE June 01 2019: Patient return for electrodiagnostic study today, which only showed evidence of mild axonal peripheral neuropathy, would not explain her continued left leg weakness, gait abnormality, which is most likely related to the residual deficit from previous stroke, she was found to have mild rigidity of left lower extremity, mild left lower extremity proximal and distal weakness, hyperreflexia, with left side Babinski signs,  She still has not had echocardiogram yet, reported 2 weeks history of Holter monitoring, but do not know the report  REVIEW OF SYSTEMS: Full 14 system review of systems performed and notable only for as above. All other review of systems were negative.  ALLERGIES: Allergies  Allergen Reactions  . Sulfa Antibiotics Hives    HOME MEDICATIONS: Current Outpatient Medications  Medication Sig Dispense Refill  . ASPIRIN 81 PO Take 1 tablet by mouth daily.    . chlorthalidone (HYGROTON) 25 MG tablet Take 1 tablet by mouth daily.  3  . gabapentin (NEURONTIN) 300 MG capsule Take 1 capsule (300 mg total) by mouth 3 (three) times daily. 90 capsule 11  . levothyroxine (SYNTHROID, LEVOTHROID) 200 MCG tablet Take 1 tablet by mouth daily.  11  . metoprolol succinate (TOPROL-XL) 100 MG 24 hr tablet Take 1 tablet by mouth daily.  3  . rosuvastatin (CRESTOR) 10 MG tablet Take 1 tablet (10 mg total) by mouth daily. 30 tablet 3   No current facility-administered medications for this visit.     PAST  MEDICAL HISTORY: Past Medical History:  Diagnosis Date  . Anemia   . Borderline personality disorder ([place]) 07/09/2016  . Dyslipidemia 07/22/2015    Overview:  Low HDL  . Heart attack ([place])   . High cholesterol   . Hypertension   . Hypothyroidism   . Ischemic stroke ([place]) 01/31/2018  . Left leg numbness 11/02/2018  . Left leg pain 11/02/2018  . Major depressive disorder, recurrent episode, severe ([place]) 01/25/2019  . Obesity   . 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 s/p MI Dec. 2005. S/P PCI and CYpher stent LAD  2. No sig. disease by cath, June,2010  3.Stress echo negative for ischemia at 10 mets in June 2013  . Orthostatic hypotension 07/22/2015  . Posttraumatic stress disorder 07/09/2016  . Stroke [facility]) 01/2018    PAST SURGICAL HISTORY: The histories are not reviewed yet. Please review them in the "History" navigator section and refresh this [place].  FAMILY HISTORY: Family History  Problem Relation Age of Onset  . Stroke Mother   . Diabetes Mother   . Hypertension Mother   . Hyperlipidemia Mother   . Diabetes Father     SOCIAL HISTORY: Social History   Socioeconomic History  . Marital status: Married    Spouse name: Not on file  . Number of children: Not on file  . Years of education: Not on file  . Highest education level: Not on file  Occupational History  . Not on file  Social Needs  . Financial resource strain: Not on file  . Food insecurity:    Worry: Not on file    Inability: Not on file  . Transportation needs:    Medical: Not on file    Non-medical: Not on file  Tobacco Use  . Smoking status: Former Smoker    Packs/day: 0.50    Types: Cigarettes  . Smokeless tobacco: Never Used  Substance and Sexual Activity  . Alcohol use: Not Currently    Comment: occassionally  . Drug use: Not Currently    Types: Marijuana, Methamphetamines, Cocaine  . Sexual activity: Not on file  Lifestyle  . Physical activity:    Days per week: Not on file    Minutes per session: Not on file  . Stress: Not on file  Relationships  . Social connections:    Talks on phone: Not on file    Gets  together: Not on file    Attends religious service: Not on file    Active member of club or organization: Not on file    Attends meetings of clubs or organizations: Not on file    Relationship status: Not on file  . Intimate partner violence:    Fear of current or ex partner: Not on file    Emotionally abused: Not on file    Physically abused: Not on file    Forced sexual activity: Not on file  Other Topics Concern  . Not on file  Social History Narrative   Lives with mother, father and fmaily   Caffeine use: soda/tea daily   Right handed     PHYSICAL EXAM   There were no vitals filed for this visit.  Not recorded      There is no height or weight on file to calculate BMI.  PHYSICAL EXAMNIATION:  Gen: NAD, conversant, well nourised, obese, well groomed                     Cardiovascular: Regular  rate rhythm, no peripheral edema, warm, nontender. Eyes: Conjunctivae clear without exudates or hemorrhage Neck: Supple, no carotid bruits. Pulmonary: Clear to auscultation bilaterally   NEUROLOGICAL EXAM:  MENTAL STATUS: Speech:    Speech is normal; fluent and spontaneous with normal comprehension.  Cognition:     Orientation to time, place and person     Normal recent and remote memory     Normal Attention span and concentration     Normal Language, naming, repeating,spontaneous speech     Fund of knowledge   CRANIAL NERVES: CN II: Visual fields are full to confrontation.  Pupils are round equal and briskly reactive to light. CN III, IV, VI: extraocular movement are normal. No ptosis. CN V: Facial sensation is intact to pinprick in all 3 divisions bilaterally. Corneal responses are intact.  CN VII: Face is symmetric with normal eye closure and smile. CN VIII: Hearing is normal to rubbing fingers CN IX, X: Palate elevates symmetrically. Phonation is normal. CN XI: Head turning and shoulder shrug are intact CN XII: Tongue is midline with normal movements and no atrophy.   MOTOR: Bilateral upper extremity motor strength is normal, she has mild rigidity of left lower extremity, mild left proximal and distal weakness,  REFLEXES: Upper reflexia of left patellar reflex, left side Babinski sign, bilateral upper extremity deep tendon reflexes were normal and symmetric.  SENSORY: Intact to light touch, pinprick, positional sensation and vibratory sensation are intact in fingers and toes.  COORDINATION: Rapid alternating movements and fine finger movements are intact. There is no dysmetria on finger-to-nose and heel-knee-shin.    GAIT/STANCE: She needs pushed up to get up from seated position, unsteady, dragging left leg some  DIAGNOSTIC DATA (LABS, IMAGING, TESTING) - I reviewed patient records, labs, notes, testing and imaging myself where available.   ASSESSMENT AND PLAN  Durward ParcelChristina Dawn Coole is a 48 y.o. female   Stroke in young patient, happened in February 2019,    No significant vascular risk factors noted, normal blood pressure today, normal A1c 5.3, LDL was 52  Complete stroke evaluation with echocardiogram  ordered hypercoagulable, inflammatory work-up  Continue ASA daily  Left leg pain, muscle spasm  EMG nerve conduction study only showed mild axonal peripheral neuropathy, her left leg difficulty mainly due to residual deficit from previous stroke  Gabapentin 100 mg 3 times a day  Continue moderate exercise   Levert FeinsteinYijun Jovahn Breit, M.D. Ph.D.  [facility] [address], [address] [place], [place] [address] Ph: [phone] Fax: [phone]  ZH:[id number]:Little, Fayrene FearingJames, MD

## 2019-06-01 NOTE — Telephone Encounter (Signed)
ECHO was ordered on Feb 4th 2020.  Please schedule.

## 2019-06-01 NOTE — Procedures (Signed)
Full Name: Jennifer Ali Gender: Female MRN #: [id number] Date of Birth: 1971/07/31    Visit Date: 06/01/2019 10:00 Age: 48 Years 46 Months Old Examining Physician: Marcial Pacas, MD  Referring Physician: Marcial Pacas, MD History: 48 year old female, with history of stroke in February 2019, continue have left lower extremity weakness and paresthesia.  Summary of the tests:  Nerve conduction study: Bilateral superficial peroneal left sural sensory responses showed mildly decreased amplitude.  Right sural response showed moderately decreased snap amplitude, with mildly prolonged peak latency.  Bilateral peroneal to EDB and tibial motor responses were normal.  Electromyography: Selected needle examinations were performed at left lower extremity muscles and left lumbosacral paraspinal muscles. There was no significant abnormality noted.  Conclusion: This is a mild abnormal study.  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  There is no evidence of left lumbosacral radiculopathy.    ------------------------------- Marcial Pacas, M.D. PhD  [facility] [place], [place] [address] Tel: [phone] Fax: [phone]        [facility]    Nerve / Sites Muscle Latency Ref. Amplitude Ref. Rel Amp Segments Distance Velocity Ref. Area    ms ms mV mV %  cm m/s m/s mVms  R Peroneal - EDB     Ankle EDB 4.4 ?6.5 4.3 ?2.0 100 Ankle - EDB 9   16.5     Fib head EDB 11.0  3.7  86.3 Fib head - Ankle 30 46 ?44 16.6     Pop fossa EDB 13.2  3.5  93.6 Pop fossa - Fib head 10 46 ?44 16.3         Pop fossa - Ankle      L Peroneal - EDB     Ankle EDB 4.1 ?6.5 6.0 ?2.0 100 Ankle - EDB 9   22.5     Fib head EDB 10.7  5.6  93.1 Fib head - Ankle 32 48 ?44 21.4     Pop fossa EDB 12.3  5.6  99.7 Pop fossa - Fib head 8 48 ?44 23.1         Pop fossa - Ankle      R Tibial - AH     Ankle AH 3.2 ?5.8 13.6 ?4.0 100 Ankle - AH 9   33.5     Pop fossa AH  12.4  11.9  87.3 Pop fossa - Ankle 38 41 ?41 29.3  L Tibial - AH     Ankle AH 3.6 ?5.8 19.5 ?4.0 100 Ankle - AH 9   43.1     Pop fossa AH 12.9  16.8  86.5 Pop fossa - Ankle 38 41 ?41 40.9             SNC    Nerve / Sites Rec. Site Peak Lat Ref.  Amp Ref. Segments Distance    ms ms V V  cm  R Sural - Ankle (Calf)     Calf Ankle 5.3 ?4.4 3 ?6 Calf - Ankle 14  L Sural - Ankle (Calf)     Calf Ankle 3.8 ?4.4 5 ?6 Calf - Ankle 14  R Superficial peroneal - Ankle     Lat leg Ankle 3.9 ?4.4 2 ?6 Lat leg - Ankle 14  L Superficial peroneal - Ankle     Lat leg Ankle 4.4 ?4.4 5 ?6 Lat leg - Ankle 14  F  Wave    Nerve F Lat Ref.   ms ms  R Tibial - AH 49.6 ?56.0  L Tibial - AH 48.0 ?56.0         EMG       EMG Summary Table    Spontaneous MUAP Recruitment  Muscle IA Fib PSW Fasc Other Amp Dur. Poly Pattern  L. Tibialis anterior Normal None None None _______ Normal Normal Normal Normal  L. Tibialis posterior Normal None None None _______ Normal Normal Normal Normal  L. Gastrocnemius (Medial head) Normal None None None _______ Normal Normal Normal Normal  L. Vastus lateralis Normal None None None _______ Normal Normal Normal Normal  L. Biceps femoris (short head) Normal None None None _______ Normal Normal Normal Normal  L. Lumbar paraspinals (mid) Normal None None None _______ Normal Normal Normal Normal  L. Lumbar paraspinals (low) Normal None None None _______ Normal Normal Normal Normal

## 2019-06-06 NOTE — Telephone Encounter (Signed)
I called and left patient a message for patient . Patient I need to verify if patient has gotten married or changed insurance so I can do authorization for her echo. Thanks Hinton Dyer

## 2019-06-08 NOTE — Telephone Encounter (Signed)
Called and left message to call me back.

## 2019-06-08 NOTE — Telephone Encounter (Signed)
Called and spoke to patient she is going to let her cardiac doctor order what she needs.

## 2019-07-12 ENCOUNTER — Telehealth: Payer: Self-pay | Admitting: Cardiology

## 2019-07-12 NOTE — Telephone Encounter (Signed)
Patient called stating her echo was cancelled due to covid and wanted to reschedule. It states Echo with limited bubble, is this the right test and can it be done at [facility]? I can reinstate and call her to schedule if this is right.

## 2019-07-13 NOTE — Telephone Encounter (Signed)
Echo rescheduled for 08/26/2019 at 11:15. Patient called and made aware.

## 2019-08-05 ENCOUNTER — Other Ambulatory Visit: Payer: Self-pay | Admitting: Cardiology

## 2019-08-26 ENCOUNTER — Other Ambulatory Visit: Payer: Self-pay

## 2019-08-26 ENCOUNTER — Ambulatory Visit (INDEPENDENT_AMBULATORY_CARE_PROVIDER_SITE_OTHER): Payer: Medicaid Other

## 2019-08-26 DIAGNOSIS — I639 Cerebral infarction, unspecified: Secondary | ICD-10-CM | POA: Diagnosis not present

## 2019-08-26 NOTE — Progress Notes (Signed)
Complete echocardiogram with bubble study has been performed.  Jimmy Grayson Pfefferle RDCS, RVT

## 2019-08-31 ENCOUNTER — Ambulatory Visit: Payer: Medicaid Other | Admitting: Cardiology

## 2019-08-31 NOTE — Progress Notes (Signed)
Cardiology Office Note:    Date:  09/01/2019   ID:  Jennifer Ali, DOB [birth year], MRN [id number]  PCP:  Aida PufferLittle, James, MD  Cardiologist:  Alver Sorrowaitlin S Walker, NP    Referring MD: Aida PufferLittle, James, MD    ASSESSMENT:    1. Abnormal echocardiogram   2. Ischemic stroke (HCC)   3. CAD in native artery   4. Essential hypertension   5. Dyslipidemia   6. Pre-procedure lab exam    PLAN:    In order of problems listed above:  1. Abnormal echocardiogram - Echocardiogram bubble study done 08/26/19 with scant immediate contrast passage above the atrial septum. Plan for TEE for further evaluation for concern of PFO.   2. Ischemic stroke - Follows closely with her neurologist. Long term monitor 01/2019 with no atrial fibrillation or flutter. Concerning bubble study with plan for TEE, as above.   3. CAD - Denies chest pain, shortness of breath. No anginal symptoms. Continue GDMT aspirin, beta blocker, statin.   4. HTN - Follows with her PCP. BP stable. Continue present anti-hypertensive regimen.   5. DLD - Follows with her PCP. Continue her high intensity statin.   Disposition: CMET, CBC, INR today. TEE scheduled for this coming Tuesday. Follow up in  2 weeks after TEE.    Medication Adjustments/Labs and Tests Ordered: Current medicines are reviewed at length with the patient today.  Concerns regarding medicines are outlined above.  Orders Placed This Encounter  Procedures  . CBC  . Basic Metabolic Panel (BMET)  . INR/PT  . ECHO TEE   No orders of the defined types were placed in this encounter.   Chief Complaint  Patient presents with  . Follow-up    after echocardiogram    History of Present Illness:    Jennifer ParcelChristina Dawn Ali is a 48 y.o. female with a hx of CAD with remote PCI, stroke 01/28/2018 her right frontal parietal area on MRI outside of the window for intervention. Carotid duplex showed a 40% stenosis of the left internal carotid artery.   She was last seen  01/25/2019. At that time she was recommended to undergo echo bubble study for evaluation after stroke. This was unfortunately delayed due to the coronavirus and completed on 08/26/19. LVEF 60-65%, impaired LV relaxation, no significant valvular dysfunction, RV normal size and function - noted scant immediate contrast passage across atrial septum.  She reports no SOB, DOE, chest pain. Tells me her legs are "sometimes jerky" but she attributes this to "restless legs" after her stroke. She does not check her blood pressure routinely at home, but is compliant with her anti-hypertensive therapy.   We had a lengthy discussion regarding the results of her echocardiogram and the concern that this passage of bloodflow could be contributory to her CVA. After discussion of risks and benefits she is agreeable to proceed with TEE.   Compliance with diet, lifestyle and medications: Yes  Echo 08/26/2019:  1. The left ventricle has normal systolic function with an ejection fraction of 60-65%. The cavity size was normal. There is mildly increased left ventricular wall thickness. Left ventricular diastolic Doppler parameters are consistent with impaired  relaxation.  2. The right ventricle has normal systolic function. The cavity was normal. There is no increase in right ventricular wall thickness.  3. I was told by my colleague Dr Bing MatterKrasowski that there was early visualization of bubbles in the LA, indicating shunting across the IA septum. Not very apparent on the available images. Clinical correlation suggested.  He participated in this evaluation  at the time of the "bubble" injection.  4. The mitral valve is grossly normal.  5. The tricuspid valve is grossly normal.  6. The aortic valve is grossly normal. Mild sclerosis of the aortic valve. Aortic valve regurgitation was not assessed by color flow Doppler.  7. The aorta is normal unless otherwise noted.  I reviewed her echo images the atrial septum appears to be  thinned but is not well visualized and the contrast injections were poor quality.  There is scant immediate contrast passage across the atrial septum and I think in her case with previous stroke she should have further evaluation including transesophageal echocardiogram.   Past Medical History:  Diagnosis Date  . Anemia   . Borderline personality disorder ([place]) 07/09/2016  . Dyslipidemia 07/22/2015   Overview:  Low HDL  . Heart attack ([place])   . High cholesterol   . Hypertension   . Hypothyroidism   . Ischemic stroke ([place]) 01/31/2018  . Left leg numbness 11/02/2018  . Left leg pain 11/02/2018  . Major depressive disorder, recurrent episode, severe ([place]) 01/25/2019  . Obesity   . 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 s/p MI Dec. 2005. S/P PCI and CYpher stent LAD  2. No sig. disease by cath, June,2010  3.Stress echo negative for ischemia at 10 mets in June 2013  . Orthostatic hypotension 07/22/2015  . Posttraumatic stress disorder 07/09/2016  . Stroke [facility]) 01/2018    Past Surgical History:  Procedure Laterality Date  . ABDOMINAL HYSTERECTOMY    . ANTERIOR CRUCIATE LIGAMENT REPAIR    . CARDIAC CATHETERIZATION    . CORONARY ANGIOPLASTY WITH STENT PLACEMENT    . DILATION AND CURETTAGE OF UTERUS    . TONSILLECTOMY      Current Medications: Current Meds  Medication Sig  . ASPIRIN 81 PO Take 1 tablet by mouth daily.  . chlorthalidone (HYGROTON) 25 MG tablet Take 1 tablet by mouth daily.  Marland Kitchen gabapentin (NEURONTIN) 300 MG capsule Take 1 capsule (300 mg total) by mouth 3 (three) times daily.  Marland Kitchen levothyroxine (SYNTHROID, LEVOTHROID) 200 MCG tablet Take 1 tablet by mouth daily.  . metFORMIN (GLUCOPHAGE) 500 MG tablet Take 500 mg by mouth 2 (two) times daily.  . metoprolol succinate (TOPROL-XL) 100 MG 24 hr tablet Take 1 tablet by mouth daily.  . prazosin (MINIPRESS) 1 MG capsule Take 1 mg by mouth daily.  . rosuvastatin (CRESTOR) 10 MG tablet Take 1 tablet (10 mg total) by  mouth daily. NEED OFFICE VISIT FOR MORE REFILLS  . sertraline (ZOLOFT) 50 MG tablet Take 50 mg by mouth daily.  . traZODone (DESYREL) 50 MG tablet Take 50 mg by mouth at bedtime.     Allergies:   Sulfa antibiotics   Social History   Socioeconomic History  . Marital status: Married    Spouse name: Not on file  . Number of children: Not on file  . Years of education: Not on file  . Highest education level: Not on file  Occupational History  . Not on file  Social Needs  . Financial resource strain: Not on file  . Food insecurity    Worry: Not on file    Inability: Not on file  . Transportation needs    Medical: Not on file    Non-medical: Not on file  Tobacco Use  . Smoking status: Former Smoker    Packs/day: 0.50    Types: Cigarettes  . Smokeless  tobacco: Never Used  Substance and Sexual Activity  . Alcohol use: Not Currently    Comment: occassionally  . Drug use: Not Currently    Types: Marijuana, Methamphetamines, Cocaine  . Sexual activity: Not on file  Lifestyle  . Physical activity    Days per week: Not on file    Minutes per session: Not on file  . Stress: Not on file  Relationships  . Social Musician on phone: Not on file    Gets together: Not on file    Attends religious service: Not on file    Active member of club or organization: Not on file    Attends meetings of clubs or organizations: Not on file    Relationship status: Not on file  Other Topics Concern  . Not on file  Social History Narrative   Lives with mother, father and fmaily   Caffeine use: soda/tea daily   Right handed     Family History: The patient's family history includes Diabetes in her father and mother; Hyperlipidemia in her mother; Hypertension in her mother; Stroke in her mother.   ROS:   Please see the history of present illness.    Review of Systems  Constitution: Negative for chills, fever and malaise/fatigue.  Cardiovascular: Negative for chest pain, dyspnea  on exertion, irregular heartbeat, leg swelling, near-syncope and palpitations.  Respiratory: Negative for cough and shortness of breath.   Musculoskeletal:       "legs are jerky after stroke"  Gastrointestinal: Negative for nausea and vomiting.  Neurological: Positive for dizziness ("sometimes when I stand up"). Negative for headaches and weakness.    All other systems reviewed and are negative.  EKGs/Labs/Other Studies Reviewed:    The following studies were reviewed today:  EKG:  No EKG today.   Recent Labs: 11/28/2018: BUN 11; Creatinine, Ser 0.76; Hemoglobin 14.6; Platelets 331; Potassium 3.6; Sodium 138  Recent Lipid Panel No results found for: CHOL, TRIG, HDL, CHOLHDL, VLDL, LDLCALC, LDLDIRECT  Physical Exam:    VS:  BP 132/90 (BP Location: Right Arm, Patient Position: Sitting, Cuff Size: Normal)   Pulse (!) 103   Resp 20   Ht 5' 8.5" (1.74 m)   Wt 230 lb 8 oz (104.6 kg)   SpO2 97%   BMI 34.54 kg/m     Wt Readings from Last 3 Encounters:  09/01/19 230 lb 8 oz (104.6 kg)  01/25/19 223 lb 6 oz (101.3 kg)  11/28/18 210 lb (95.3 kg)    GEN:  Well nourished, well developed in no acute distress HEENT: Normal NECK: No JVD; No carotid bruits LYMPHATICS: No lymphadenopathy CARDIAC: RRR, no murmurs, rubs, gallops RESPIRATORY:  Clear to auscultation without rales, wheezing or rhonchi  ABDOMEN: Soft, non-tender, non-distended MUSCULOSKELETAL:  No edema; No deformity  SKIN: Warm and dry NEUROLOGIC:  Alert and oriented x 3 PSYCHIATRIC:  Normal affect   Signed, Alver Sorrow, NP  09/01/2019 5:08 PM     [facility]

## 2019-09-01 ENCOUNTER — Other Ambulatory Visit: Payer: Self-pay

## 2019-09-01 ENCOUNTER — Ambulatory Visit (INDEPENDENT_AMBULATORY_CARE_PROVIDER_SITE_OTHER): Payer: Medicaid Other | Admitting: Cardiology

## 2019-09-01 ENCOUNTER — Encounter: Payer: Self-pay | Admitting: Family

## 2019-09-01 VITALS — BP 132/90 | HR 103 | Resp 20 | Ht 68.5 in | Wt 230.5 lb

## 2019-09-01 DIAGNOSIS — Z01812 Encounter for preprocedural laboratory examination: Secondary | ICD-10-CM

## 2019-09-01 DIAGNOSIS — R931 Abnormal findings on diagnostic imaging of heart and coronary circulation: Secondary | ICD-10-CM

## 2019-09-01 DIAGNOSIS — I2510 Atherosclerotic heart disease of native coronary artery without angina pectoris: Secondary | ICD-10-CM | POA: Diagnosis not present

## 2019-09-01 DIAGNOSIS — I10 Essential (primary) hypertension: Secondary | ICD-10-CM

## 2019-09-01 DIAGNOSIS — I639 Cerebral infarction, unspecified: Secondary | ICD-10-CM

## 2019-09-01 DIAGNOSIS — E785 Hyperlipidemia, unspecified: Secondary | ICD-10-CM

## 2019-09-01 NOTE — Patient Instructions (Addendum)
Medication Instructions:  Your physician recommends that you continue on your current medications as directed. Please refer to the Current Medication list given to you today.  If you need a refill on your cardiac medications before your next appointment, please call your pharmacy.   Lab work: Your physician recommends that you return for lab work today: PT/INR, CBC, BMP.  If you have labs (blood work) drawn today and your tests are completely normal, you will receive your results only by: Marland Kitchen MyChart Message (if you have MyChart) OR . A paper copy in the mail If you have any lab test that is abnormal or we need to change your treatment, we will call you to review the results.  Testing/Procedures:  You will have pre-procedure drive thru COVID testing on Saturday, 09/03/2019, at 10:25 am at the [facility] located at [place], [place]. Please arrive 15 minutes early and go to the building overhang. DO NOT go to the tent or tent line.   You are scheduled for a TEE on Tuesday, 09/06/2019 with Dr. Oval Linsey.  Please arrive at the [facility] (Main Entrance A) at [facility]: [address] [place], [place] [address] at 11:00 am.  DIET: Nothing to eat or drink after midnight except a sip of water with medications (see medication instructions below)  Medication Instructions: Hold your metformin and chlorthalidone the day of testing until testing is completed.   Continue your anticoagulant: aspirin 81 mg daily You will need to continue your anticoagulant after your procedure until you are told by your Provider that it is safe to stop   Labs: None needed.   You must have a responsible person to drive you home and stay in the waiting area during your procedure. Failure to do so could result in cancellation.  Bring your insurance cards.  *Special Note: Every effort is made to have your procedure done on time. Occasionally there are emergencies that occur at the  hospital that may cause delays. Please be patient if a delay does occur.    Follow-Up: At [facility], you and your health needs are our priority.  As part of our continuing mission to provide you with exceptional heart care, we have created designated Provider Care Teams.  These Care Teams include your primary Cardiologist (physician) and Advanced Practice Providers (APPs -  Physician Assistants and Nurse Practitioners) who all work together to provide you with the care you need, when you need it. You will need a follow up appointment in 2 weeks.

## 2019-09-02 ENCOUNTER — Telehealth: Payer: Self-pay

## 2019-09-02 LAB — BASIC METABOLIC PANEL
BUN/Creatinine Ratio: 11 (ref 9–23)
BUN: 10 mg/dL (ref 6–24)
CO2: 27 mmol/L (ref 20–29)
Calcium: 9.4 mg/dL (ref 8.7–10.2)
Chloride: 99 mmol/L (ref 96–106)
Creatinine, Ser: 0.93 mg/dL (ref 0.57–1.00)
GFR calc Af Amer: 84 mL/min/{1.73_m2} (ref >59)
GFR calc non Af Amer: 73 mL/min/{1.73_m2} (ref >59)
Glucose: 96 mg/dL (ref 65–99)
Potassium: 3.2 mmol/L — ABNORMAL LOW (ref 3.5–5.2)
Sodium: 141 mmol/L (ref 134–144)

## 2019-09-02 LAB — CBC
Hematocrit: 45.1 % (ref 34.0–46.6)
Hemoglobin: 14.9 g/dL (ref 11.1–15.9)
MCH: 32.9 pg (ref 26.6–33.0)
MCHC: 33 g/dL (ref 31.5–35.7)
MCV: 100 fL — ABNORMAL HIGH (ref 79–97)
Platelets: 332 10*3/uL (ref 150–450)
RBC: 4.53 x10E6/uL (ref 3.77–5.28)
RDW: 12.7 % (ref 11.7–15.4)
WBC: 11.5 10*3/uL — ABNORMAL HIGH (ref 3.4–10.8)

## 2019-09-02 LAB — PROTIME-INR
INR: 0.9 (ref 0.8–1.2)
Prothrombin Time: 10.2 s (ref 9.1–12.0)

## 2019-09-02 MED ORDER — POTASSIUM CHLORIDE CRYS ER 20 MEQ PO TBCR: 20.0000 meq | EXTENDED_RELEASE_TABLET | Freq: Every day | ORAL | 0 refills | Status: DC

## 2019-09-02 NOTE — Telephone Encounter (Signed)
-----   Message from Richardo Priest, MD sent at 09/02/2019  8:03 AM EDT ----- Normal or stable result  Low potassium she has not taken diuretic lets have her initiate K-Tab 20 mEq daily #90

## 2019-09-02 NOTE — Telephone Encounter (Signed)
Patient advised of lab results with low potassium per Dr Bettina Gavia.  Patient advised to start potassium 20 meq once daily.  Rx sent to pharmacy.  Patient agreed to plan and verbalized understanding.  No further questions.

## 2019-09-03 ENCOUNTER — Other Ambulatory Visit (HOSPITAL_COMMUNITY)
Admission: RE | Admit: 2019-09-03 | Discharge: 2019-09-03 | Disposition: A | Discharge status: Home / Self Care | Payer: Medicaid Other | Source: Ambulatory Visit | Attending: Cardiovascular Disease | Admitting: Cardiovascular Disease

## 2019-09-03 DIAGNOSIS — Z20828 Contact with and (suspected) exposure to other viral communicable diseases: Secondary | ICD-10-CM | POA: Diagnosis not present

## 2019-09-03 DIAGNOSIS — Z01812 Encounter for preprocedural laboratory examination: Secondary | ICD-10-CM | POA: Insufficient documentation

## 2019-09-03 LAB — SARS CORONAVIRUS 2 (TAT 6-24 HRS): SARS Coronavirus 2: NEGATIVE

## 2019-09-05 NOTE — Progress Notes (Signed)
Pt states she has remained quarantined since her covid test. Pt verbalizes understanding of NPO status and visitor status.   

## 2019-09-06 ENCOUNTER — Ambulatory Visit (HOSPITAL_COMMUNITY)
Admission: RE | Admit: 2019-09-06 | Discharge: 2019-09-06 | Disposition: A | Discharge status: Home / Self Care | Payer: Medicaid Other | Attending: Cardiovascular Disease | Admitting: Cardiovascular Disease

## 2019-09-06 ENCOUNTER — Other Ambulatory Visit: Payer: Self-pay

## 2019-09-06 ENCOUNTER — Ambulatory Visit (HOSPITAL_BASED_OUTPATIENT_CLINIC_OR_DEPARTMENT_OTHER): Payer: Medicaid Other

## 2019-09-06 ENCOUNTER — Encounter (HOSPITAL_COMMUNITY)
Admission: RE | Disposition: A | Discharge status: Home / Self Care | Payer: Medicaid Other | Source: Home / Self Care | Attending: Cardiovascular Disease

## 2019-09-06 ENCOUNTER — Encounter (HOSPITAL_COMMUNITY): Payer: Self-pay | Admitting: Cardiovascular Disease

## 2019-09-06 DIAGNOSIS — F329 Major depressive disorder, single episode, unspecified: Secondary | ICD-10-CM | POA: Diagnosis not present

## 2019-09-06 DIAGNOSIS — Z79890 Hormone replacement therapy (postmenopausal): Secondary | ICD-10-CM | POA: Diagnosis not present

## 2019-09-06 DIAGNOSIS — Z87891 Personal history of nicotine dependence: Secondary | ICD-10-CM | POA: Diagnosis not present

## 2019-09-06 DIAGNOSIS — E669 Obesity, unspecified: Secondary | ICD-10-CM | POA: Diagnosis not present

## 2019-09-06 DIAGNOSIS — I10 Essential (primary) hypertension: Secondary | ICD-10-CM | POA: Insufficient documentation

## 2019-09-06 DIAGNOSIS — E785 Hyperlipidemia, unspecified: Secondary | ICD-10-CM | POA: Diagnosis not present

## 2019-09-06 DIAGNOSIS — I6389 Other cerebral infarction: Secondary | ICD-10-CM | POA: Diagnosis not present

## 2019-09-06 DIAGNOSIS — Z7982 Long term (current) use of aspirin: Secondary | ICD-10-CM | POA: Insufficient documentation

## 2019-09-06 DIAGNOSIS — E039 Hypothyroidism, unspecified: Secondary | ICD-10-CM | POA: Insufficient documentation

## 2019-09-06 DIAGNOSIS — I2510 Atherosclerotic heart disease of native coronary artery without angina pectoris: Secondary | ICD-10-CM | POA: Insufficient documentation

## 2019-09-06 DIAGNOSIS — Z79899 Other long term (current) drug therapy: Secondary | ICD-10-CM | POA: Insufficient documentation

## 2019-09-06 DIAGNOSIS — E7800 Pure hypercholesterolemia, unspecified: Secondary | ICD-10-CM | POA: Diagnosis not present

## 2019-09-06 DIAGNOSIS — I639 Cerebral infarction, unspecified: Secondary | ICD-10-CM

## 2019-09-06 DIAGNOSIS — I252 Old myocardial infarction: Secondary | ICD-10-CM | POA: Insufficient documentation

## 2019-09-06 DIAGNOSIS — Q211 Atrial septal defect: Secondary | ICD-10-CM | POA: Diagnosis not present

## 2019-09-06 DIAGNOSIS — R931 Abnormal findings on diagnostic imaging of heart and coronary circulation: Secondary | ICD-10-CM

## 2019-09-06 HISTORY — PX: BUBBLE STUDY: SHX6837

## 2019-09-06 HISTORY — PX: TEE WITHOUT CARDIOVERSION: SHX5443

## 2019-09-06 SURGERY — ECHOCARDIOGRAM, TRANSESOPHAGEAL
Anesthesia: Moderate Sedation

## 2019-09-06 MED ORDER — FENTANYL CITRATE (PF) 100 MCG/2ML IJ SOLN
INTRAMUSCULAR | Status: AC
  Filled 2019-09-06: qty 4

## 2019-09-06 MED ORDER — MIDAZOLAM HCL (PF) 5 MG/ML IJ SOLN
INTRAMUSCULAR | Status: AC
  Filled 2019-09-06: qty 2

## 2019-09-06 MED ORDER — BUTAMBEN-TETRACAINE-BENZOCAINE 2-2-14 % EX AERO
INHALATION_SPRAY | CUTANEOUS | Status: DC | PRN
  Administered 2019-09-06: 12:00:00 2 via TOPICAL

## 2019-09-06 MED ORDER — MIDAZOLAM HCL (PF) 10 MG/2ML IJ SOLN
INTRAMUSCULAR | Status: DC | PRN
  Administered 2019-09-06 (×2): 2 mg via INTRAVENOUS

## 2019-09-06 MED ORDER — SODIUM CHLORIDE 0.9 % IV SOLN
INTRAVENOUS | Status: DC
  Administered 2019-09-06: 500 mL via INTRAVENOUS

## 2019-09-06 MED ORDER — DIPHENHYDRAMINE HCL 50 MG/ML IJ SOLN
INTRAMUSCULAR | Status: AC
  Filled 2019-09-06: qty 1

## 2019-09-06 MED ORDER — FENTANYL CITRATE (PF) 100 MCG/2ML IJ SOLN
INTRAMUSCULAR | Status: DC | PRN
  Administered 2019-09-06 (×2): 25 ug via INTRAVENOUS

## 2019-09-06 NOTE — H&P (Signed)
Jennifer Ali is a 48 y.o. female who has presented today for surgery, with the diagnosis of stroke.  The various methods of treatment have been discussed with the patient and family. After consideration of risks, benefits and other options for treatment, the patient has consented to  Procedure(s): TRANSESOPHAGEAL ECHOCARDIOGRAM (TEE) (N/A) as a surgical intervention .  The patient's history has been reviewed, patient examined, no change in status, stable for surgery.  I have reviewed the patient's chart and labs.  Questions were answered to the patient's satisfaction.    Zidan Helget C. Oval Linsey, MD, [facility]  09/06/2019 12:05 PM

## 2019-09-06 NOTE — Discharge Instructions (Signed)

## 2019-09-06 NOTE — CV Procedure (Signed)
Brief TEE Note  LVEF >55% No LA/LAA thrombus or mass +PFO by color flow Doppler and saline microcavitation study R to L flow noted with snoring and abdominal pressure Trivial MR  For additional details see full report.  During this procedure the patient is administered a total of Versed 4 mg and Fentanyl 50 mcg to achieve and maintain moderate conscious sedation.  The patient's heart rate, blood pressure, and oxygen saturation are monitored continuously during the procedure. The period of conscious sedation is 16 minutes, of which I was present face-to-face 100% of this time.  Roxine Whittinghill C. Oval Linsey, MD, [facility] 09/06/2019 12:31 PM

## 2019-09-06 NOTE — Progress Notes (Signed)
  Echocardiogram Echocardiogram Transesophageal has been performed.  Jennifer Ali 09/06/2019, 12:43 PM

## 2019-09-07 ENCOUNTER — Encounter (HOSPITAL_COMMUNITY): Payer: Self-pay | Admitting: Cardiovascular Disease

## 2019-09-11 NOTE — Progress Notes (Signed)
Office Visit    Patient Name: Jennifer Ali Date of Encounter: 09/12/2019  Primary Care Provider:  Tamsen Roers, MD Primary Cardiologist:  Shirlee More, MD Electrophysiologist:  None   Chief Complaint    Jennifer Ali is a 48 y.o. female with a hx of CAD with remote PCI, CVS 01/28/18 of R frontal parietal area on MRA outside window for intervention, echo bubble study with concern for PFO presents today for follow up after TEE.    Past Medical History    Past Medical History:  Diagnosis Date  . Anemia   . Borderline personality disorder ([place]) 07/09/2016  . Coronary artery disease   . Dyslipidemia 07/22/2015   Overview:  Low HDL  . Heart attack ([place])   . High cholesterol   . Hypertension   . Hypothyroidism   . Ischemic stroke ([place]) 01/31/2018  . Left leg numbness 11/02/2018  . Left leg pain 11/02/2018  . Major depressive disorder, recurrent episode, severe ([place]) 01/25/2019  . Obesity   . 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 s/p MI Dec. 2005. S/P PCI and CYpher stent LAD  2. No sig. disease by cath, June,2010  3.Stress echo negative for ischemia at 10 mets in June 2013  . Orthostatic hypotension 07/22/2015  . Patent foramen ovale    (a) by TEE 09/06/19 "small PFO with predominantly R to L shunting across the atrial septum"  . Posttraumatic stress disorder 07/09/2016   Past Surgical History:  Procedure Laterality Date  . ABDOMINAL HYSTERECTOMY    . ANTERIOR CRUCIATE LIGAMENT REPAIR    . BUBBLE STUDY  09/06/2019   Procedure: BUBBLE STUDY;  Surgeon: Skeet Latch, MD;  Location: [facility];  Service: Cardiovascular;;  . CARDIAC CATHETERIZATION    . CORONARY ANGIOPLASTY WITH STENT PLACEMENT    . DILATION AND CURETTAGE OF UTERUS    . TEE WITHOUT CARDIOVERSION N/A 09/06/2019   Procedure: TRANSESOPHAGEAL ECHOCARDIOGRAM (TEE);  Surgeon: Skeet Latch, MD;  Location: [place] [facility];  Service: Cardiovascular;  Laterality: N/A;  .  TONSILLECTOMY      Allergies  Allergies  Allergen Reactions  . Sulfa Antibiotics Hives    History of Present Illness    Jennifer Ali is a 48 y.o. female with a hx of CAD with remote PCI, HTN, DLD, stroke 01/28/18 of R frontal parietal area on MRI outside window for intervention last seen by Dr. Bettina Gavia 08/31/28. She has undergone evaluation to determine the etiology of her CVA. 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   She underwent bubble study 08/26/19 with LVEF 60-65%, impaired LV relaxation, no significant valvular dysfunction, RV normal size and function, but noted scant immediate contrast passage across atrial septum. She was asked to undergo TEE, and agreed.   TEE 09/06/19 showed a small PFO with predominantly right to left shunting across the atrial septum and that the interatrial septum is aneurysmal.   Walking down the road for exercise. Lives with her family - parents, brother, sister, nieces and nephews.  She endorses she does not follow a heart healthy diet and is interested in losing weight but tells me she will have to make some significant dietary changes of the eat out quite often.  She denies chest pain, pressure, DOE, SOB, palpitations.    We discussed her TEE in detail.  She had vague recollection of her discussion after procedure but all questions were answered to her satisfaction.  Education provided on PFO and its potential relationship  with her CVA.  Discussed plan of care to proceed with PFO closure if indicated-her records have been sent to Dr. Excell Seltzerooper for review.  Or consideration of ILR with EP to assess further for arrhythmia precipitating ischemic CVA.  EKGs/Labs/Other Studies Reviewed:   The following studies were reviewed today:  09/06/19 TEE    1. The left ventricle has normal systolic function, with an ejection fraction of 60-65%. The cavity size was normal. No evidence of left ventricular regional wall motion abnormalities.  2. The right  ventricle has normal systolc function. The cavity was normal. There is no increase in right ventricular wall thickness.  3. Calcified papillary muscle.  4. No evidence of a thrombus present in the left atrial appendage.  5. Right atrial size was mildly dilated.  6. No evidence of mitral valve stenosis.  7. The aortic valve is tricuspid No stenosis of the aortic valve.  8. The aortic root, ascending aorta and descending aorta are normal in size and structure.  9. Small patent foramen ovale with predominantly right to left shunting across the atrial septum. 10. Evidence of atrial level shunting detected by color flow Doppler. 11. The interatrial septum is aneurysmal.  EKG:  No EKG today.  Recent Labs: 09/01/2019: BUN 10; Creatinine, Ser 0.93; Hemoglobin 14.9; Platelets 332; Potassium 3.2; Sodium 141  Recent Lipid Panel No results found for: CHOL, TRIG, HDL, CHOLHDL, VLDL, LDLCALC, LDLDIRECT  Home Medications   Current Meds  Medication Sig  . aspirin (ASPIRIN 81) 81 MG EC tablet Take 81 mg by mouth daily.   . chlorthalidone (HYGROTON) 25 MG tablet Take 25 mg by mouth daily.   Marland Kitchen. gabapentin (NEURONTIN) 300 MG capsule Take 1 capsule (300 mg total) by mouth 3 (three) times daily.  Marland Kitchen. levothyroxine (SYNTHROID, LEVOTHROID) 200 MCG tablet Take 200 mcg by mouth daily before breakfast.   . metFORMIN (GLUCOPHAGE) 500 MG tablet Take 500 mg by mouth 2 (two) times daily.  . metoprolol succinate (TOPROL-XL) 100 MG 24 hr tablet Take 100 mg by mouth daily.   . potassium chloride SA (KLOR-CON M20) 20 MEQ tablet Take 1 tablet (20 mEq total) by mouth daily.  . prazosin (MINIPRESS) 1 MG capsule Take 1 mg by mouth at bedtime.   . rosuvastatin (CRESTOR) 10 MG tablet Take 1 tablet (10 mg total) by mouth daily. NEED OFFICE VISIT FOR MORE REFILLS  . sertraline (ZOLOFT) 50 MG tablet Take 50 mg by mouth daily.  . traZODone (DESYREL) 50 MG tablet Take 50 mg by mouth at bedtime.      Review of Systems       Review of Systems  Constitution: Negative for chills, fever and malaise/fatigue.  Cardiovascular: Negative for chest pain, dyspnea on exertion, leg swelling, near-syncope and palpitations.  Respiratory: Negative for cough, shortness of breath and wheezing.   Gastrointestinal: Negative for nausea and vomiting.  Neurological: Negative for dizziness, light-headedness and weakness.   All other systems reviewed and are otherwise negative except as noted above.  Physical Exam    VS:  BP 118/70 (BP Location: Left Arm, Patient Position: Sitting, Cuff Size: Normal)   Pulse 73   Ht 5' 8.5" (1.74 m)   Wt 236 lb (107 kg)   SpO2 94%   BMI 35.36 kg/m  , BMI Body mass index is 35.36 kg/m. GEN: Well nourished, well developed, in no acute distress. HEENT: normal. Neck: Supple, no JVD, carotid bruits, or masses. Cardiac: RRR, no murmurs, rubs, or gallops. No clubbing, cyanosis, edema.  Radials/DP/PT 2+ and equal bilaterally.  Respiratory:  Respirations regular and unlabored, clear to auscultation bilaterally. GI: Soft, nontender, nondistended, BS + x 4. MS: No deformity or atrophy. Skin: Warm and dry, no rash. Neuro:  Strength and sensation are intact. Psych: Normal affect.  Assessment & Plan    1. PFO - Hx ischemic CVA 01/28/18 to R frontal parietal area on MRI outside window for intervention. Cared for at [facility]. Echo bubble study 08/26/19 with concern for PFO. TEE 9/51/20 showed small PFO with predominantly right to left shunting and aneurysmal interatrial septum. Report sent to Dr. Excell Seltzer for review to consider PFO closure in setting of previous ischemic CVA.  2. CAD - Stable, no anginal symptoms. Continue GDMT aspirin, beta blocker, statin. Encouraged to continue walking regimen. Encouraged heart healthy diet.   3. HTN - BP well controlled today. Continue present anti-hypertensive regimen. BP goal <130/80.  4. DLD - Continue Crestor 10mg  daily. Will need lipids at next office visit if not done by  PCP.   5. Hx ischemic CVA - 01/28/18 R frontal parietal area on MRI outside window for intervention. Cared for at [facility], still walks with cane, but overall much improved as at onset she would hardly walk. One of our goals of cardiology care is to determine etiology and prevent recurrence. If PFO does not require closer, as above. Consider referral to EP for implantation of ILR to rule out arrhthymias precipitating thrombus. She did have 2 week Zio 02/01/19 with no atrial fibrillation, no flutter.   6. PAT - Noted on Zio 02/01/19. Denies palpitations, irregular heart beats today.   Plan of care discussed with Dr. Dulce Sellar.   Disposition: Follow up in 3 month(s) with Dr. Curlene Labrum, NP 09/12/2019, 3:31 PM

## 2019-09-12 ENCOUNTER — Encounter: Payer: Self-pay | Admitting: Family

## 2019-09-12 ENCOUNTER — Other Ambulatory Visit: Payer: Self-pay

## 2019-09-12 ENCOUNTER — Ambulatory Visit (INDEPENDENT_AMBULATORY_CARE_PROVIDER_SITE_OTHER): Payer: Medicaid Other | Admitting: Family

## 2019-09-12 VITALS — BP 118/70 | HR 73 | Ht 68.5 in | Wt 236.0 lb

## 2019-09-12 DIAGNOSIS — Q211 Atrial septal defect: Secondary | ICD-10-CM | POA: Diagnosis not present

## 2019-09-12 DIAGNOSIS — Q2112 Patent foramen ovale: Secondary | ICD-10-CM | POA: Insufficient documentation

## 2019-09-12 DIAGNOSIS — E876 Hypokalemia: Secondary | ICD-10-CM

## 2019-09-12 DIAGNOSIS — I2510 Atherosclerotic heart disease of native coronary artery without angina pectoris: Secondary | ICD-10-CM | POA: Diagnosis not present

## 2019-09-12 DIAGNOSIS — I639 Cerebral infarction, unspecified: Secondary | ICD-10-CM

## 2019-09-12 DIAGNOSIS — I10 Essential (primary) hypertension: Secondary | ICD-10-CM | POA: Diagnosis not present

## 2019-09-12 NOTE — Patient Instructions (Addendum)
Medication Instructions:  No medication changes today.   If you need a refill on your cardiac medications before your next appointment, please call your pharmacy.   Lab work: Your physician recommends that you return for lab work today: BMET  If you have labs (blood work) drawn today and your tests are completely normal, you will receive your results only by: Marland Kitchen MyChart Message (if you have MyChart) OR . A paper copy in the mail If you have any lab test that is abnormal or we need to change your treatment, we will call you to review the results.  Testing/Procedures: No testing today.  Follow-Up: At [facility], you and your health needs are our priority.  As part of our continuing mission to provide you with exceptional heart care, we have created designated Provider Care Teams.  These Care Teams include your primary Cardiologist (physician) and Advanced Practice Providers (APPs -  Physician Assistants and Nurse Practitioners) who all work together to provide you with the care you need, when you need it. You will need a follow up appointment in 3 months.  Please call our office 2 months in advance to schedule this appointment.  You may see Shirlee More, MD or another member of our [place] Provider Team in [place]:  Jenne Campus, MD . Jyl Heinz, MD . Berniece Salines, MD  Any Other Special Instructions Will Be Listed Below (If Applicable).  Your TEE showed a 'Patent Foramen Ovale' which is a small hole between your right and left atrium.   We have sent your images to Dr. Burt Knack - an interventional cardiologist in our group - to review and see if your PFO needs closed.   If Dr. Burt Knack does not think the PFO needs closure, then we will consider and 'Implanted Loop Recorder' with one of our Electrophysiologist (this is a long term heart monitor that is a small device planted just below the skin). This monitor will look for heart rhythms which put you at risk for stroke. An  electrophysiologist is a heart doctor who focuses on the electrical activity in your heart.    Patent Foramen Ovale, Adult   A foramen ovale is a hole between the upper chambers (right atrium and left atrium) of the heart. Before you are born, it is normal to have this hole in your heart. The hole allows blood to circulate through the body without having to go through the lungs. After your birth, when you are able to breathe, you do not need the foramen ovale and it usually closes. If the hole does not close, it is called a patent foramen ovale (PFO). PFO is a common condition. Most people do not know they have this hole and they do not have any health problems caused by it. What are the causes? The cause of this condition is not known. What increases the risk? You are more likely to develop this condition if:  You have a family history of PFO.  You also have other heart disease that is present at birth (congenital heart disease). What are the signs or symptoms? In most cases, there are no symptoms of this condition. Possible rare symptoms include:  Stroke caused by a blood clot.  Transient ischemic attack (TIA). This is a "warning stroke" that causes stroke-like symptoms that go away quickly.  Migraine headaches. How is this diagnosed? This condition may be diagnosed based on:  A physical exam and your medical history.  Echocardiogram. This test uses sound waves to produce  images of the heart.  Transesophageal echocardiogram (TEE). This type of echocardiogram is performed by placing a probe in the part of the body that moves food from the mouth to the stomach (esophagus).  Electrocardiogram (ECG). This test identifies changes in the electrical activity of the heart.  Cardiac MRI. This is an imaging technique that is used to visualize the heart, if further images are needed after TEE. How is this treated? Usually, no treatment is needed. If your condition is associated with  symptoms or blood clots, you may need:  Medicines to prevent blood clots and strokes (anticoagulant or antiplatelet medicines).  A surgical procedure to close the hole (transcatheter closure). Follow these instructions at home:  Take over-the-counter and prescription medicines only as told by your health care provider.  Keep all follow-up visits as told by your health care provider. This is important. Contact a health care provider if:  You have a fever.  You have frequent or severe headaches. Get help right away if:  Your skin turns blue.  You have chest pain or difficulty breathing.  You have any symptoms of stroke. "BE FAST" is an easy way to remember the main warning signs of stroke: ? B - Balance. Signs are dizziness, sudden trouble walking, or loss of balance. ? E - Eyes. Signs are trouble seeing or a sudden change in vision. ? F - Face. Signs are sudden weakness or numbness of the face, or the face or eyelid drooping on one side. ? A - Arms. Signs are weakness or numbness in an arm. This happens suddenly and usually on one side of the body. ? S - Speech. Signs are sudden trouble speaking, slurred speech, or trouble understanding what people say. ? T - Time. Time to call emergency services. Write down what time symptoms started.  You have other signs of stroke, which may include: ? A sudden, severe headache with no known cause. ? Nausea or vomiting. ? Seizure. These symptoms may represent a serious problem that is an emergency. Do not wait to see if the symptoms will go away. Get medical help right away. Call your local emergency services (911 in the [place] Do not drive yourself to the hospital. Summary  A patent foramen ovale is a hole between the upper chambers (right atrium and left atrium) of your heart. The cause of this condition is not known.  You may not know that you have a hole in your heart, and you may not have any health problems associated with  it.  Usually, no treatment is needed for this condition unless it is associated with symptoms or blood clots. This information is not intended to replace advice given to you by your health care provider. Make sure you discuss any questions you have with your health care provider. Document Released: 02/17/2017 Document Revised: 02/12/2018 Document Reviewed: 02/17/2017 Elsevier Patient Education  2020 [employer].

## 2019-09-14 ENCOUNTER — Telehealth: Payer: Self-pay

## 2019-09-14 LAB — BASIC METABOLIC PANEL
BUN/Creatinine Ratio: 16 (ref 9–23)
BUN: 13 mg/dL (ref 6–24)
CO2: 24 mmol/L (ref 20–29)
Calcium: 9.1 mg/dL (ref 8.7–10.2)
Chloride: 102 mmol/L (ref 96–106)
Creatinine, Ser: 0.83 mg/dL (ref 0.57–1.00)
GFR calc Af Amer: 96 mL/min/{1.73_m2} (ref >59)
GFR calc non Af Amer: 84 mL/min/{1.73_m2} (ref >59)
Glucose: 98 mg/dL (ref 65–99)
Potassium: 4.1 mmol/L (ref 3.5–5.2)
Sodium: 140 mmol/L (ref 134–144)

## 2019-09-14 NOTE — Telephone Encounter (Signed)
-----   Message from Sherren Mocha, MD sent at 09/13/2019  1:49 PM EDT ----- Regarding: RE: TEE with PFO - consider for closure? Marco Collie I looked at her TEE images. I'm happy to see her. Will arrange an outpatient consultation. Thx Ronalee Belts ----- Message ----- From: Loel Dubonnet, NP Sent: 09/11/2019   4:56 PM EDT To: Sherren Mocha, MD Subject: TEE with PFO - consider for closure?           Hi Dr. Burt Knack,   Seeing one of Dr. Joya Gaskins patient for a follow up as he is on vacation. I discussed with him briefly, and we were wondering if you could review her studies for consideration of PFO closure.   Miss Ramberg is 48 yo female with PMH CAD with remote PCI and stent 2005-2006, HTN, HLD. First seen by Dr. Bettina Gavia 01/2019 to establish care. CVA 01/28/18 of R frontal parietal area on MRI outside window for intervention. It was noted at that time that echo had been discussed and ordered by previous providers, but never completed.   Additional testing to determine etiology of CVA includes... -Carotid duplex 40% stenosis of L ICA -Zio with no atrial fibrillation/flutter  Underwent bubble study 08/26/2019 - reviewed images by Dr. Bettina Gavia with result note "I reviewed her echo images the atrial septum appears to be thinned but is not well visualized and the contrast injections were poor quality.  There is scant immediate contrast passage across the atrial septum and I think in her case with previous stroke she should have further evaluation including transesophageal echocardiogram.  She is also overdue to be seen in the office but scheduled to see Dr. Harriet Masson in the med center [facility] office this month regarding consideration of a transesophageal echocardiogram to define patent foramen ovale and assist in decision-making whether she has high risk markers that would cause [place] to consider closure."  Underwent TEE on 09/06/19 with noted small PFO and aneurysmal interatrial septum.   Thanks,  Loel Dubonnet,  NP

## 2019-09-14 NOTE — Telephone Encounter (Signed)
Scheduled patient for PFO consult with Dr. Burt Knack 10/16. She was grateful for assistance.

## 2019-09-30 ENCOUNTER — Other Ambulatory Visit: Payer: Self-pay | Admitting: Cardiology

## 2019-10-07 ENCOUNTER — Ambulatory Visit: Payer: Medicaid Other | Admitting: Cardiovascular Disease

## 2019-10-07 ENCOUNTER — Encounter: Payer: Self-pay | Admitting: Cardiovascular Disease

## 2019-10-07 ENCOUNTER — Other Ambulatory Visit: Payer: Self-pay

## 2019-10-07 VITALS — BP 108/76 | HR 67 | Ht 68.5 in | Wt 235.6 lb

## 2019-10-07 DIAGNOSIS — Q211 Atrial septal defect: Secondary | ICD-10-CM

## 2019-10-07 DIAGNOSIS — Q2112 Patent foramen ovale: Secondary | ICD-10-CM

## 2019-10-07 MED ORDER — CLOPIDOGREL BISULFATE 75 MG PO TABS: 75.0000 mg | ORAL_TABLET | Freq: Every day | ORAL | 0 refills | Status: DC

## 2019-10-07 NOTE — Progress Notes (Signed)
[facility]   STRUCTURAL HEART TEAM  Date:  10/13/2019   ID:  Jennifer Ali, DOB 12/22/1970, MRN [id number]  PCP:  Little, James, MD   Chief Complaint  Patient presents with  . PFO Evaluation     HISTORY OF PRESENT ILLNESS: Jennifer Ali is a 48 y.o. female who presents for evaluation of PFO, referred by Dr Munley and Caitlin Walker, NP.  The patient is here alone today. She presented in 2019 with weakness and numbness down her left arm and leg. She was treated in [place] and was told that she had a stroke but she was well outside of the treatment window. She required inpatient rehab for a few weeks after her stroke. This was the first and only clinical stroke that the patient has had.   She continues to have problems with ambulation and she walks with a cane. She reports poor balance and residual problems with her memory. She denies chest pain or shortness of breath.   She also has a hx of MI and reports undergoing a PCI procedure 15 years ago at [place] but there are no records available.   The patient denies any history of nickel allergy or reaction.  Past Medical History:  Diagnosis Date  . Anemia   . Borderline personality disorder (HCC) 07/09/2016  . Coronary artery disease   . Dyslipidemia 07/22/2015   Overview:  Low HDL  . Heart attack (HCC)   . High cholesterol   . Hypertension   . Hypothyroidism   . Ischemic stroke (HCC) 01/31/2018  . Left leg numbness 11/02/2018  . Left leg pain 11/02/2018  . Major depressive disorder, recurrent episode, severe (HCC) 01/25/2019  . Obesity   . 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 s/p MI Dec. 2005. S/P PCI and CYpher stent LAD  2. No sig. disease by cath, June,2010  3.Stress echo negative for ischemia at 10 mets in June 2013  . Orthostatic hypotension 07/22/2015  . Patent foramen ovale    (a) by TEE 09/06/19 "small PFO with predominantly R to L shunting across the atrial  septum"  . Posttraumatic stress disorder 07/09/2016    Current Outpatient Medications  Medication Sig Dispense Refill  . aspirin (ASPIRIN 81) 81 MG EC tablet Take 81 mg by mouth daily.     . chlorthalidone (HYGROTON) 25 MG tablet Take 25 mg by mouth daily.   3  . gabapentin (NEURONTIN) 300 MG capsule Take 1 capsule (300 mg total) by mouth 3 (three) times daily. 90 capsule 11  . levothyroxine (SYNTHROID, LEVOTHROID) 200 MCG tablet Take 200 mcg by mouth daily before breakfast.   11  . metFORMIN (GLUCOPHAGE) 500 MG tablet Take 500 mg by mouth 2 (two) times daily.    . metoprolol succinate (TOPROL-XL) 100 MG 24 hr tablet Take 100 mg by mouth daily.   3  . potassium chloride SA (KLOR-CON M20) 20 MEQ tablet Take 1 tablet (20 mEq total) by mouth daily. 90 tablet 0  . prazosin (MINIPRESS) 1 MG capsule Take 1 mg by mouth at bedtime.     . rosuvastatin (CRESTOR) 10 MG tablet Take 1 tablet by mouth once daily 60 tablet 0  . sertraline (ZOLOFT) 50 MG tablet Take 50 mg by mouth daily.    . traZODone (DESYREL) 50 MG tablet Take 50 mg by mouth at bedtime.    . clopidogrel (PLAVIX) 75 MG tablet Take 1 tablet (75 mg total) by mouth   daily. 90 tablet 0   No current facility-administered medications for this visit.     ALLERGIES:   Sulfa antibiotics   SOCIAL HISTORY:  The patient  reports that she has quit smoking. Her smoking use included cigarettes. She smoked 0.50 packs per day. She has never used smokeless tobacco. She reports previous alcohol use. She reports previous drug use. Drugs: Marijuana, Methamphetamines, and Cocaine.   FAMILY HISTORY:  The patient's family history includes Diabetes in her father and mother; Hyperlipidemia in her mother; Hypertension in her mother; Stroke in her mother.   REVIEW OF SYSTEMS:  See HP.   All other systems are reviewed and negative.   PHYSICAL EXAM: VS:  BP 108/76   Pulse 67   Ht 5' 8.5" (1.74 m)   Wt 235 lb 9.6 oz (106.9 kg)   SpO2 96%   BMI 35.30 kg/m  ,  BMI Body mass index is 35.3 kg/m. GEN: Well nourished, well developed, in no acute distress HEENT: normal Neck: No JVD. carotids 2+ without bruits or masses Cardiac: The heart is RRR without murmurs, rubs, or gallops. No edema. Pedal pulses 2+ = bilaterally  Respiratory:  clear to auscultation bilaterally GI: soft, nontender, nondistended, + BS MS: no deformity or atrophy Skin: warm and dry, no rash Neuro:  Strength and sensation are intact Psych: euthymic mood, full affect  RECENT LABS: 10/07/2019: BUN 17; Creatinine, Ser 1.06; Hemoglobin 13.9; Platelets 289; Potassium 3.5; Sodium 146  No results found for requested labs within last 8760 hours.   Estimated Creatinine Clearance: 83.8 mL/min (A) (by C-G formula based on SCr of 1.06 mg/dL (H)).   Wt Readings from Last 3 Encounters:  10/07/19 235 lb 9.6 oz (106.9 kg)  09/12/19 236 lb (107 kg)  09/01/19 230 lb 8 oz (104.6 kg)     PERTINENT STUDIES:  MRI Brain:  IMPRESSION: 1. No acute intracranial abnormality identified. 2. Small chronic infarct of the right superior frontoparietal junction. 3. Nonspecific white matter hyperintensities and volume loss of the brain unexpected for age. Some differential considerations include age advanced microvascular ischemic changes, vasculopathy, demyelination, or sequelae of an infectious/inflammatory process.   TEE:  IMPRESSIONS    1. The left ventricle has normal systolic function, with an ejection fraction of 60-65%. The cavity size was normal. No evidence of left ventricular regional wall motion abnormalities.  2. The right ventricle has normal systolc function. The cavity was normal. There is no increase in right ventricular wall thickness.  3. Calcified papillary muscle.  4. No evidence of a thrombus present in the left atrial appendage.  5. Right atrial size was mildly dilated.  6. No evidence of mitral valve stenosis.  7. The aortic valve is tricuspid No stenosis of the  aortic valve.  8. The aortic root, ascending aorta and descending aorta are normal in size and structure.  9. Small patent foramen ovale with predominantly right to left shunting across the atrial septum. 10. Evidence of atrial level shunting detected by color flow Doppler. 11. The interatrial septum is aneurysmal.   Event Monitor: Study Highlights  An extended ZIO monitor was performed for 14 days beginning 02/01/2019 to evaluate palpitation and tachycardia.  The rhythm throughout is sinus minimum average and maximum heart rates of 50, 81 and 149 bpm.  There were no episodes of pauses 3 seconds or greater or high degree AV node or sinus node block.\  Ventricular ectopy is rare with isolated APCs  Supraventricular ectopy is rare with APCs couplets triplets  and 3 episodes of brief atrial tachycardia.  The longest and fastest episode lasted 9.9 seconds at a rate of 160 bpm.  There were no episodes of atrial fibrillation or flutter.  There was one triggered event showing sinus rhythm.  There were no symptomatic events.    Conclusion, rare ventricular and supraventricular ectopy.    ASSESSMENT AND PLAN: PFO, moderate: pt with cryptogenic stroke.   Radiographic results, hospital notes, lab results, and cardiac imaging data are reviewed. TEE images are reviewed and demonstrate normal LV and RV function, no significant valvular disease, and a moderate sized PFO.   The patient's Rope Score is 6, indicating a moderate probability that the patient's stroke is 'PFO-related.'  The patient is counseled about the association of PFO and cryptogenic stroke. Available clinical trial data is reviewed, specifically those trials comparing transcatheter PFO closure and medical therapy with antiplatelet drugs. The patient understands the potential benefit of PFO closure with respect to secondary stroke reduction compared with medical therapy alone. Specific risks of transcatheter PFO closure are  reviewed with the patient. These risks include bleeding, infection, device embolization, stroke, cardiac perforation, tamponade, arrhythmia, MI, and late device erosion. She understands these serious risks occur at low incidence of < 1%.  The patient understands the need to take dual antiplatelet therapy with aspirin and clopidogrel together for a minimum period of 3 months following transcatheter PFO closure.  They understand the need to follow SBE prophylaxis per guidelines for a period of 6 months following PFO closure.  The patient provides full informed consent for the procedure.   Tonny Bollman 10/13/2019 5:55 PM

## 2019-10-07 NOTE — H&P (View-Only) (Signed)
[facility]   STRUCTURAL HEART TEAM  Date:  10/13/2019   ID:  Jennifer Ali, DOB Sep 03, 1971, MRN [id number]  PCP:  Aida Puffer, MD   Chief Complaint  Patient presents with  . PFO Evaluation     HISTORY OF PRESENT ILLNESS: Jennifer Ali is a 48 y.o. female who presents for evaluation of PFO, referred by Dr Dulce Sellar and Gillian Shields, NP.  The patient is here alone today. She presented in 2019 with weakness and numbness down her left arm and leg. She was treated in [facility] and was told that she had a stroke but she was well outside of the treatment window. She required inpatient rehab for a few weeks after her stroke. This was the first and only clinical stroke that the patient has had.   She continues to have problems with ambulation and she walks with a cane. She reports poor balance and residual problems with her memory. She denies chest pain or shortness of breath.   She also has a hx of MI and reports undergoing a PCI procedure 15 years ago at [facility] but there are no records available.   The patient denies any history of nickel allergy or reaction.  Past Medical History:  Diagnosis Date  . Anemia   . Borderline personality disorder (HCC) 07/09/2016  . Coronary artery disease   . Dyslipidemia 07/22/2015   Overview:  Low HDL  . Heart attack (HCC)   . High cholesterol   . Hypertension   . Hypothyroidism   . Ischemic stroke (HCC) 01/31/2018  . Left leg numbness 11/02/2018  . Left leg pain 11/02/2018  . Major depressive disorder, recurrent episode, severe (HCC) 01/25/2019  . Obesity   . 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 s/p MI Dec. 2005. S/P PCI and CYpher stent LAD  2. No sig. disease by cath, June,2010  3.Stress echo negative for ischemia at 10 mets in June 2013  . Orthostatic hypotension 07/22/2015  . Patent foramen ovale    (a) by TEE 09/06/19 "small PFO with predominantly R to L shunting across the atrial  septum"  . Posttraumatic stress disorder 07/09/2016    Current Outpatient Medications  Medication Sig Dispense Refill  . aspirin (ASPIRIN 81) 81 MG EC tablet Take 81 mg by mouth daily.     . chlorthalidone (HYGROTON) 25 MG tablet Take 25 mg by mouth daily.   3  . gabapentin (NEURONTIN) 300 MG capsule Take 1 capsule (300 mg total) by mouth 3 (three) times daily. 90 capsule 11  . levothyroxine (SYNTHROID, LEVOTHROID) 200 MCG tablet Take 200 mcg by mouth daily before breakfast.   11  . metFORMIN (GLUCOPHAGE) 500 MG tablet Take 500 mg by mouth 2 (two) times daily.    . metoprolol succinate (TOPROL-XL) 100 MG 24 hr tablet Take 100 mg by mouth daily.   3  . potassium chloride SA (KLOR-CON M20) 20 MEQ tablet Take 1 tablet (20 mEq total) by mouth daily. 90 tablet 0  . prazosin (MINIPRESS) 1 MG capsule Take 1 mg by mouth at bedtime.     . rosuvastatin (CRESTOR) 10 MG tablet Take 1 tablet by mouth once daily 60 tablet 0  . sertraline (ZOLOFT) 50 MG tablet Take 50 mg by mouth daily.    . traZODone (DESYREL) 50 MG tablet Take 50 mg by mouth at bedtime.    . clopidogrel (PLAVIX) 75 MG tablet Take 1 tablet (75 mg total) by mouth  daily. 90 tablet 0   No current facility-administered medications for this visit.     ALLERGIES:   Sulfa antibiotics   SOCIAL HISTORY:  The patient  reports that she has quit smoking. Her smoking use included cigarettes. She smoked 0.50 packs per day. She has never used smokeless tobacco. She reports previous alcohol use. She reports previous drug use. Drugs: Marijuana, Methamphetamines, and Cocaine.   FAMILY HISTORY:  The patient's family history includes Diabetes in her father and mother; Hyperlipidemia in her mother; Hypertension in her mother; Stroke in her mother.   REVIEW OF SYSTEMS:  See HP.   All other systems are reviewed and negative.   PHYSICAL EXAM: VS:  BP 108/76   Pulse 67   Ht 5' 8.5" (1.74 m)   Wt 235 lb 9.6 oz (106.9 kg)   SpO2 96%   BMI 35.30 kg/m  ,  BMI Body mass index is 35.3 kg/m. GEN: Well nourished, well developed, in no acute distress HEENT: normal Neck: No JVD. carotids 2+ without bruits or masses Cardiac: The heart is RRR without murmurs, rubs, or gallops. No edema. Pedal pulses 2+ = bilaterally  Respiratory:  clear to auscultation bilaterally GI: soft, nontender, nondistended, + BS MS: no deformity or atrophy Skin: warm and dry, no rash Neuro:  Strength and sensation are intact Psych: euthymic mood, full affect  RECENT LABS: 10/07/2019: BUN 17; Creatinine, Ser 1.06; Hemoglobin 13.9; Platelets 289; Potassium 3.5; Sodium 146  No results found for requested labs within last 8760 hours.   Estimated Creatinine Clearance: 83.8 mL/min (A) (by C-G formula based on SCr of 1.06 mg/dL (H)).   Wt Readings from Last 3 Encounters:  10/07/19 235 lb 9.6 oz (106.9 kg)  09/12/19 236 lb (107 kg)  09/01/19 230 lb 8 oz (104.6 kg)     PERTINENT STUDIES:  MRI Brain:  IMPRESSION: 1. No acute intracranial abnormality identified. 2. Small chronic infarct of the right superior frontoparietal junction. 3. Nonspecific white matter hyperintensities and volume loss of the brain unexpected for age. Some differential considerations include age advanced microvascular ischemic changes, vasculopathy, demyelination, or sequelae of an infectious/inflammatory process.   TEE:  IMPRESSIONS    1. The left ventricle has normal systolic function, with an ejection fraction of 60-65%. The cavity size was normal. No evidence of left ventricular regional wall motion abnormalities.  2. The right ventricle has normal systolc function. The cavity was normal. There is no increase in right ventricular wall thickness.  3. Calcified papillary muscle.  4. No evidence of a thrombus present in the left atrial appendage.  5. Right atrial size was mildly dilated.  6. No evidence of mitral valve stenosis.  7. The aortic valve is tricuspid No stenosis of the  aortic valve.  8. The aortic root, ascending aorta and descending aorta are normal in size and structure.  9. Small patent foramen ovale with predominantly right to left shunting across the atrial septum. 10. Evidence of atrial level shunting detected by color flow Doppler. 11. The interatrial septum is aneurysmal.   Event Monitor: Study Highlights  An extended ZIO monitor was performed for 14 days beginning 02/01/2019 to evaluate palpitation and tachycardia.  The rhythm throughout is sinus minimum average and maximum heart rates of 50, 81 and 149 bpm.  There were no episodes of pauses 3 seconds or greater or high degree AV node or sinus node block.\  Ventricular ectopy is rare with isolated APCs  Supraventricular ectopy is rare with APCs couplets triplets  and 3 episodes of brief atrial tachycardia.  The longest and fastest episode lasted 9.9 seconds at a rate of 160 bpm.  There were no episodes of atrial fibrillation or flutter.  There was one triggered event showing sinus rhythm.  There were no symptomatic events.    Conclusion, rare ventricular and supraventricular ectopy.    ASSESSMENT AND PLAN: PFO, moderate: pt with cryptogenic stroke.   Radiographic results, hospital notes, lab results, and cardiac imaging data are reviewed. TEE images are reviewed and demonstrate normal LV and RV function, no significant valvular disease, and a moderate sized PFO.   The patient's Rope Score is 6, indicating a moderate probability that the patient's stroke is 'PFO-related.'  The patient is counseled about the association of PFO and cryptogenic stroke. Available clinical trial data is reviewed, specifically those trials comparing transcatheter PFO closure and medical therapy with antiplatelet drugs. The patient understands the potential benefit of PFO closure with respect to secondary stroke reduction compared with medical therapy alone. Specific risks of transcatheter PFO closure are  reviewed with the patient. These risks include bleeding, infection, device embolization, stroke, cardiac perforation, tamponade, arrhythmia, MI, and late device erosion. She understands these serious risks occur at low incidence of < 1%.  The patient understands the need to take dual antiplatelet therapy with aspirin and clopidogrel together for a minimum period of 3 months following transcatheter PFO closure.  They understand the need to follow SBE prophylaxis per guidelines for a period of 6 months following PFO closure.  The patient provides full informed consent for the procedure.   Tonny Bollman 10/13/2019 5:55 PM

## 2019-10-07 NOTE — Patient Instructions (Addendum)
Medication instructions: 1) START PLAVIX 75 mg 5 days prior to your procedure   COVID SCREENING INFORMATION: You are scheduled for COVID SCREENING on 10/17/2019 at 11:05AM. [facility]) [address] Stay in the RIGHT lane, tell them you are there for pre-procedure testing! Please leave your pets at home for COVID testing.   PFO CLOSURE INSTRUCTIONS: You are scheduled for a PFO CLOSURE on Thursday, October 20, 2019 with Dr. Burt Knack.  1. Please arrive at the [facility] (Main Entrance A) at [facility]: [address] [place], [place] [address] at: 7:30AM (This time is two hours before your procedure to ensure your preparation). You are allowed ONE visitor in the hospital with you. Both you and your visitor must wear masks. Special note: Every effort is made to have your procedure done on time. Please understand that emergencies sometimes delay scheduled procedures.  2. Diet: Do not eat solid foods after midnight.  You may have clear liquids until 5am upon the day of the procedure.  3. Labs: Today!  4. Medication instructions in preparation for your procedure:  1) Hold Metformin the morning of your cath (we don't want your sugar to drop)  2) Make sure to take your ASPIRIN and PLAVIX the morning of your procedure  3) You may take your other medications as directed with sips of water  5. Plan for one night stay--bring personal belongings. 6. Bring a current list of your medications and current insurance cards. 7. You MUST have a responsible person to drive you home. 8. Someone MUST be with you the first 24 hours after you arrive home or your discharge will be delayed. 9. Please wear clothes that are easy to get on and off and wear slip-on shoes.  Thank you for allowing [place] to care for you!   -- [facility]

## 2019-10-08 LAB — CBC WITH DIFFERENTIAL/PLATELET
Basophils Absolute: 0 10*3/uL (ref 0.0–0.2)
Basos: 0 %
EOS (ABSOLUTE): 0.2 10*3/uL (ref 0.0–0.4)
Eos: 1 %
Hematocrit: 40.6 % (ref 34.0–46.6)
Hemoglobin: 13.9 g/dL (ref 11.1–15.9)
Immature Grans (Abs): 0 10*3/uL (ref 0.0–0.1)
Immature Granulocytes: 0 %
Lymphocytes Absolute: 2.7 10*3/uL (ref 0.7–3.1)
Lymphs: 23 %
MCH: 32 pg (ref 26.6–33.0)
MCHC: 34.2 g/dL (ref 31.5–35.7)
MCV: 94 fL (ref 79–97)
Monocytes Absolute: 0.6 10*3/uL (ref 0.1–0.9)
Monocytes: 5 %
Neutrophils Absolute: 7.9 10*3/uL — ABNORMAL HIGH (ref 1.4–7.0)
Neutrophils: 71 %
Platelets: 289 10*3/uL (ref 150–450)
RBC: 4.34 x10E6/uL (ref 3.77–5.28)
RDW: 12.8 % (ref 11.7–15.4)
WBC: 11.4 10*3/uL — ABNORMAL HIGH (ref 3.4–10.8)

## 2019-10-08 LAB — BASIC METABOLIC PANEL
BUN/Creatinine Ratio: 16 (ref 9–23)
BUN: 17 mg/dL (ref 6–24)
CO2: 27 mmol/L (ref 20–29)
Calcium: 9.1 mg/dL (ref 8.7–10.2)
Chloride: 102 mmol/L (ref 96–106)
Creatinine, Ser: 1.06 mg/dL — ABNORMAL HIGH (ref 0.57–1.00)
GFR calc Af Amer: 72 mL/min/{1.73_m2} (ref >59)
GFR calc non Af Amer: 62 mL/min/{1.73_m2} (ref >59)
Glucose: 109 mg/dL — ABNORMAL HIGH (ref 65–99)
Potassium: 3.5 mmol/L (ref 3.5–5.2)
Sodium: 146 mmol/L — ABNORMAL HIGH (ref 134–144)

## 2019-10-12 ENCOUNTER — Encounter: Payer: Self-pay | Admitting: Cardiovascular Disease

## 2019-10-17 ENCOUNTER — Other Ambulatory Visit (HOSPITAL_COMMUNITY)
Admission: RE | Admit: 2019-10-17 | Discharge: 2019-10-17 | Disposition: A | Discharge status: Home / Self Care | Payer: Medicaid Other | Source: Ambulatory Visit | Attending: Cardiovascular Disease | Admitting: Cardiovascular Disease

## 2019-10-17 DIAGNOSIS — Z01812 Encounter for preprocedural laboratory examination: Secondary | ICD-10-CM | POA: Insufficient documentation

## 2019-10-17 DIAGNOSIS — Z20828 Contact with and (suspected) exposure to other viral communicable diseases: Secondary | ICD-10-CM | POA: Insufficient documentation

## 2019-10-18 ENCOUNTER — Telehealth: Payer: Self-pay | Admitting: *Deleted

## 2019-10-18 LAB — NOVEL CORONAVIRUS, NAA (HOSP ORDER, SEND-OUT TO REF LAB; TAT 18-24 HRS): SARS-CoV-2, NAA: NOT DETECTED

## 2019-10-18 NOTE — Telephone Encounter (Addendum)
Pt contacted pre-PFO closure scheduled at [facility] for: Thursday October 20, 2019 9:30 AM Verified arrival time and place: [facility]) at: 7:30 AM   No solid food after midnight prior to cath, clear liquids until 5 AM day of procedure.  Hold: Metformin-AM of procedure Chlorthalidone/KCl-AM of procedure  Except hold medications AM meds can be  taken pre-cath with sip of water including: ASA 81 mg Plavix 75 mg  Confirmed patient has responsible adult to drive home post procedure and observe 24 hours after arriving home: yes  Currently, due to Covid-19 pandemic, only one support person will be allowed with patient. Must be the same support person for that patient's entire stay, will be screened and required to wear a mask. They will be asked to wait in the waiting room for the duration of the patient's stay.  Patients are required to wear a mask when they enter the hospital.      COVID-19 Pre-Screening Questions:  . In the past 7 to 10 days have you had a cough,  shortness of breath, headache, congestion, fever (100 or greater) body aches, chills, sore throat, or sudden loss of taste or sense of smell? no . Have you been around anyone with known Covid 19? no . Have you been around anyone who is awaiting Covid 19 test results in the past 7 to 10 days? no . Have you been around anyone who has been exposed to Covid 19, or has mentioned symptoms of Covid 19 within the past 7 to 10 days? no  I reviewed procedure/mask/visitor instructions, Covid-19 screening questions with patient, she verbalized understanding, thanked me for call.

## 2019-10-20 ENCOUNTER — Ambulatory Visit (HOSPITAL_COMMUNITY)
Admission: RE | Admit: 2019-10-20 | Discharge: 2019-10-20 | Disposition: A | Discharge status: Home / Self Care | Payer: Medicaid Other | Attending: Cardiovascular Disease | Admitting: Cardiovascular Disease

## 2019-10-20 ENCOUNTER — Ambulatory Visit (HOSPITAL_BASED_OUTPATIENT_CLINIC_OR_DEPARTMENT_OTHER): Payer: Medicaid Other

## 2019-10-20 ENCOUNTER — Encounter (HOSPITAL_COMMUNITY)
Admission: RE | Disposition: A | Discharge status: Home / Self Care | Payer: Self-pay | Source: Home / Self Care | Attending: Cardiovascular Disease

## 2019-10-20 ENCOUNTER — Other Ambulatory Visit: Payer: Self-pay

## 2019-10-20 DIAGNOSIS — Z79890 Hormone replacement therapy (postmenopausal): Secondary | ICD-10-CM | POA: Diagnosis not present

## 2019-10-20 DIAGNOSIS — Q2112 Patent foramen ovale: Secondary | ICD-10-CM

## 2019-10-20 DIAGNOSIS — Z955 Presence of coronary angioplasty implant and graft: Secondary | ICD-10-CM | POA: Insufficient documentation

## 2019-10-20 DIAGNOSIS — Q211 Atrial septal defect: Secondary | ICD-10-CM | POA: Insufficient documentation

## 2019-10-20 DIAGNOSIS — E039 Hypothyroidism, unspecified: Secondary | ICD-10-CM | POA: Diagnosis not present

## 2019-10-20 DIAGNOSIS — Z87891 Personal history of nicotine dependence: Secondary | ICD-10-CM | POA: Diagnosis not present

## 2019-10-20 DIAGNOSIS — Z882 Allergy status to sulfonamides status: Secondary | ICD-10-CM | POA: Insufficient documentation

## 2019-10-20 DIAGNOSIS — I10 Essential (primary) hypertension: Secondary | ICD-10-CM | POA: Diagnosis not present

## 2019-10-20 DIAGNOSIS — E785 Hyperlipidemia, unspecified: Secondary | ICD-10-CM | POA: Insufficient documentation

## 2019-10-20 DIAGNOSIS — Z8673 Personal history of transient ischemic attack (TIA), and cerebral infarction without residual deficits: Secondary | ICD-10-CM | POA: Diagnosis not present

## 2019-10-20 DIAGNOSIS — I252 Old myocardial infarction: Secondary | ICD-10-CM | POA: Insufficient documentation

## 2019-10-20 DIAGNOSIS — Z7902 Long term (current) use of antithrombotics/antiplatelets: Secondary | ICD-10-CM | POA: Diagnosis not present

## 2019-10-20 DIAGNOSIS — Z8249 Family history of ischemic heart disease and other diseases of the circulatory system: Secondary | ICD-10-CM | POA: Diagnosis not present

## 2019-10-20 DIAGNOSIS — I2510 Atherosclerotic heart disease of native coronary artery without angina pectoris: Secondary | ICD-10-CM | POA: Diagnosis not present

## 2019-10-20 DIAGNOSIS — Z823 Family history of stroke: Secondary | ICD-10-CM | POA: Insufficient documentation

## 2019-10-20 DIAGNOSIS — Z79899 Other long term (current) drug therapy: Secondary | ICD-10-CM | POA: Insufficient documentation

## 2019-10-20 DIAGNOSIS — Z7982 Long term (current) use of aspirin: Secondary | ICD-10-CM | POA: Diagnosis not present

## 2019-10-20 DIAGNOSIS — Z7984 Long term (current) use of oral hypoglycemic drugs: Secondary | ICD-10-CM | POA: Insufficient documentation

## 2019-10-20 DIAGNOSIS — E669 Obesity, unspecified: Secondary | ICD-10-CM | POA: Diagnosis not present

## 2019-10-20 DIAGNOSIS — Z6835 Body mass index (BMI) 35.0-35.9, adult: Secondary | ICD-10-CM | POA: Diagnosis not present

## 2019-10-20 HISTORY — PX: PATENT FORAMEN OVALE(PFO) CLOSURE: CATH118300

## 2019-10-20 LAB — ECHOCARDIOGRAM LIMITED
Height: 68.5 in
Weight: 3760 oz

## 2019-10-20 LAB — GLUCOSE, CAPILLARY: Glucose-Capillary: 107 mg/dL — ABNORMAL HIGH (ref 70–99)

## 2019-10-20 LAB — POCT ACTIVATED CLOTTING TIME
Activated Clotting Time: 230 seconds
Activated Clotting Time: 169 seconds

## 2019-10-20 SURGERY — PATENT FORAMEN OVALE (PFO) CLOSURE
Anesthesia: LOCAL

## 2019-10-20 MED ORDER — MIDAZOLAM HCL 2 MG/2ML IJ SOLN
INTRAMUSCULAR | Status: DC | PRN
  Administered 2019-10-20 (×2): 1 mg via INTRAVENOUS

## 2019-10-20 MED ORDER — FENTANYL CITRATE (PF) 100 MCG/2ML IJ SOLN
INTRAMUSCULAR | Status: DC | PRN
  Administered 2019-10-20: 25 ug via INTRAVENOUS
  Administered 2019-10-20: 50 ug via INTRAVENOUS

## 2019-10-20 MED ORDER — SODIUM CHLORIDE 0.9% FLUSH: 3.0000 mL | INTRAVENOUS | Status: DC | PRN

## 2019-10-20 MED ORDER — SODIUM CHLORIDE 0.9% FLUSH: 3.0000 mL | Freq: Two times a day (BID) | INTRAVENOUS | Status: DC

## 2019-10-20 MED ORDER — LABETALOL HCL 5 MG/ML IV SOLN: 10.0000 mg | INTRAVENOUS | Status: DC | PRN

## 2019-10-20 MED ORDER — HEPARIN SODIUM (PORCINE) 1000 UNIT/ML IJ SOLN
INTRAMUSCULAR | Status: DC | PRN
  Administered 2019-10-20: 7000 [IU] via INTRAVENOUS

## 2019-10-20 MED ORDER — FENTANYL CITRATE (PF) 100 MCG/2ML IJ SOLN
INTRAMUSCULAR | Status: AC
  Filled 2019-10-20: qty 2

## 2019-10-20 MED ORDER — ONDANSETRON HCL 4 MG/2ML IJ SOLN: 4.0000 mg | Freq: Four times a day (QID) | INTRAMUSCULAR | Status: DC | PRN

## 2019-10-20 MED ORDER — SODIUM CHLORIDE 0.9 % IV SOLN: 250.0000 mL | INTRAVENOUS | Status: DC | PRN

## 2019-10-20 MED ORDER — ACETAMINOPHEN 325 MG PO TABS: 650.0000 mg | ORAL_TABLET | ORAL | Status: DC | PRN

## 2019-10-20 MED ORDER — CEFAZOLIN SODIUM-DEXTROSE 2-4 GM/100ML-% IV SOLN
INTRAVENOUS | Status: AC
  Filled 2019-10-20: qty 100

## 2019-10-20 MED ORDER — CEFAZOLIN SODIUM-DEXTROSE 2-4 GM/100ML-% IV SOLN
2.0000 g | INTRAVENOUS | Status: AC
  Administered 2019-10-20: 2 g via INTRAVENOUS

## 2019-10-20 MED ORDER — HEPARIN SODIUM (PORCINE) 1000 UNIT/ML IJ SOLN
INTRAMUSCULAR | Status: AC
  Filled 2019-10-20: qty 1

## 2019-10-20 MED ORDER — SODIUM CHLORIDE 0.9 % WEIGHT BASED INFUSION: 1.0000 mL/kg/h | INTRAVENOUS | Status: DC

## 2019-10-20 MED ORDER — CLOPIDOGREL BISULFATE 75 MG PO TABS: 75.0000 mg | ORAL_TABLET | ORAL | Status: DC

## 2019-10-20 MED ORDER — HYDRALAZINE HCL 20 MG/ML IJ SOLN: 10.0000 mg | INTRAMUSCULAR | Status: DC | PRN

## 2019-10-20 MED ORDER — LIDOCAINE HCL (PF) 1 % IJ SOLN
INTRAMUSCULAR | Status: AC
  Filled 2019-10-20: qty 30

## 2019-10-20 MED ORDER — HEPARIN (PORCINE) IN NACL 1000-0.9 UT/500ML-% IV SOLN
INTRAVENOUS | Status: AC
  Filled 2019-10-20: qty 1000

## 2019-10-20 MED ORDER — AMOXICILLIN 500 MG PO TABS: 2000.0000 mg | ORAL_TABLET | ORAL | 0 refills | Status: DC

## 2019-10-20 MED ORDER — HEPARIN (PORCINE) IN NACL 1000-0.9 UT/500ML-% IV SOLN
INTRAVENOUS | Status: DC | PRN
  Administered 2019-10-20 (×3): 500 mL

## 2019-10-20 MED ORDER — MIDAZOLAM HCL 5 MG/5ML IJ SOLN
INTRAMUSCULAR | Status: AC
  Filled 2019-10-20: qty 5

## 2019-10-20 MED ORDER — LIDOCAINE HCL (PF) 1 % IJ SOLN
INTRAMUSCULAR | Status: DC | PRN
  Administered 2019-10-20: 20 mL

## 2019-10-20 MED ORDER — SODIUM CHLORIDE 0.9 % WEIGHT BASED INFUSION
3.0000 mL/kg/h | INTRAVENOUS | Status: AC
  Administered 2019-10-20: 1 mL/kg/h via INTRAVENOUS
  Administered 2019-10-20: 3 mL/kg/h via INTRAVENOUS

## 2019-10-20 MED ORDER — ASPIRIN 81 MG PO CHEW: 81.0000 mg | CHEWABLE_TABLET | ORAL | Status: DC

## 2019-10-20 SURGICAL SUPPLY — 16 items
CATH ACUNAV REPROCESSED (CATHETERS) ×1 IMPLANT
CATH SUPER TORQUE PLUS 6F MPA1 (CATHETERS) ×1 IMPLANT
COVER SWIFTLINK CONNECTOR (BAG) ×1 IMPLANT
GUIDEWIRE AMPLATZER 1.5JX260 (WIRE) ×1 IMPLANT
HOVERMATT SINGLE USE (MISCELLANEOUS) ×1 IMPLANT
OCCLUDER AMPLATZER PFO 25MM (Prosthesis & Implant Heart) ×1 IMPLANT
PACK CARDIAC CATHETERIZATION (CUSTOM PROCEDURE TRAY) ×2 IMPLANT
PROTECTION STATION PRESSURIZED (MISCELLANEOUS) ×2
SHEATH INTROD W/O MIN 9FR 25CM (SHEATH) ×1 IMPLANT
SHEATH PINNACLE 8F 10CM (SHEATH) ×1 IMPLANT
SHEATH PINNACLE 9F 10CM (SHEATH) ×1 IMPLANT
SHEATH PROBE COVER 6X72 (BAG) ×1 IMPLANT
STATION PROTECTION PRESSURIZED (MISCELLANEOUS) IMPLANT
SYS DELIVER AMP TREVISIO 9FR (SHEATH) ×2
SYSTEM DELIVER AMP TREVIS 9FR (SHEATH) IMPLANT
WIRE EMERALD 3MM-J .035X150CM (WIRE) ×1 IMPLANT

## 2019-10-20 NOTE — Interval H&P Note (Signed)
History and Physical Interval Note:  10/20/2019 8:36 AM  [place]  has presented today for surgery, with the diagnosis of PFO.  The various methods of treatment have been discussed with the patient and family. After consideration of risks, benefits and other options for treatment, the patient has consented to  Procedure(s): PATENT FORAMEN OVALE (PFO) CLOSURE (N/A) as a surgical intervention.  The patient's history has been reviewed, patient examined, no change in status, stable for surgery.  I have reviewed the patient's chart and labs.  Questions were answered to the patient's satisfaction.     Jennifer Ali

## 2019-10-20 NOTE — Progress Notes (Signed)
Site area: rt groin fv sheaths x2 Site Prior to Removal:  Level 0 Pressure Applied For: 20 minutes Manual:   yes Patient Status During Pull:  stable Post Pull Site:  Level  0 Post Pull Instructions Given: yes   Post Pull Pulses Present: rt dp palpable Dressing Applied:  Gauze and tegaderm Bedrest begins @ [id number] Comments:

## 2019-10-20 NOTE — Discharge Instructions (Signed)
You will require antibiotics prior to any dental work, including cleanings, for 6 months after your PFO/ASD closure. This is to protect the device from potentially getting infected from bacteria in your mouth entering your bloodstream. The medication has been called into your pharmacy on file. Please pick this up to have ready before any scheduled dental work. Instructions will be outlined on the bottle. The medication should be taken 1 hour prior to your dental appointment. ° ° °Groin Site Care °Refer to this sheet in the next few weeks. These instructions provide you with information on caring for yourself after your procedure. Your caregiver may also give you more specific instructions. Your treatment has been planned according to current medical practices, but problems sometimes occur. Call your caregiver if you have any problems or questions after your procedure. °HOME CARE INSTRUCTIONS °· You may shower 24 hours after the procedure. Remove the bandage (dressing) and gently wash the site with plain soap and water. Gently pat the site dry.  °· Do not apply powder or lotion to the site.  °· Do not sit in a bathtub, swimming pool, or whirlpool for 5 to 7 days.  °· No bending, squatting, or lifting anything over 10 pounds (4.5 kg) for 1 week °· Inspect the site at least twice daily.  °· Do not drive home if you are discharged the same day of the procedure. Have someone else drive you.  °· You may drive 24 hours after the procedure unless otherwise instructed by your caregiver.  °What to expect: °· Any bruising will usually fade within 1 to 2 weeks.  °· Blood that collects in the tissue (hematoma) may be painful to the touch. It should usually decrease in size and tenderness within 1 to 2 weeks.  °SEEK IMMEDIATE MEDICAL CARE IF: °· You have unusual pain at the groin site or down the affected leg.  °· You have redness, warmth, swelling, or pain at the groin site.  °· You have drainage (other than a small amount of  blood on the dressing).  °· You have chills.  °· You have a fever or persistent symptoms for more than 72 hours.  °· You have a fever and your symptoms suddenly get worse.  °· Your leg becomes pale, cool, tingly, or numb.  °You have heavy bleeding from the site. Hold pressure on the site. ° °

## 2019-10-20 NOTE — Progress Notes (Signed)
No bleeding or swelling noted after ambulation 

## 2019-10-20 NOTE — Progress Notes (Signed)
  Echocardiogram 2D Echocardiogram limited has been performed.  Darlina Sicilian M 10/20/2019, 11:49 AM

## 2019-10-21 ENCOUNTER — Encounter (HOSPITAL_COMMUNITY): Payer: Self-pay | Admitting: Cardiovascular Disease

## 2019-10-27 ENCOUNTER — Encounter: Payer: Self-pay | Admitting: Neurology

## 2019-10-31 ENCOUNTER — Ambulatory Visit (INDEPENDENT_AMBULATORY_CARE_PROVIDER_SITE_OTHER): Payer: Medicaid Other | Admitting: Neurology

## 2019-10-31 ENCOUNTER — Encounter: Payer: Self-pay | Admitting: Neurology

## 2019-10-31 ENCOUNTER — Other Ambulatory Visit: Payer: Self-pay

## 2019-10-31 VITALS — BP 107/76 | HR 59 | Temp 97.5°F | Ht 68.5 in | Wt 237.0 lb

## 2019-10-31 DIAGNOSIS — G4753 Recurrent isolated sleep paralysis: Secondary | ICD-10-CM | POA: Diagnosis not present

## 2019-10-31 DIAGNOSIS — R442 Other hallucinations: Secondary | ICD-10-CM | POA: Diagnosis not present

## 2019-10-31 DIAGNOSIS — G4711 Idiopathic hypersomnia with long sleep time: Secondary | ICD-10-CM | POA: Diagnosis not present

## 2019-10-31 DIAGNOSIS — G4719 Other hypersomnia: Secondary | ICD-10-CM | POA: Diagnosis not present

## 2019-10-31 DIAGNOSIS — Q211 Atrial septal defect: Secondary | ICD-10-CM

## 2019-10-31 DIAGNOSIS — I252 Old myocardial infarction: Secondary | ICD-10-CM

## 2019-10-31 DIAGNOSIS — Q2112 Patent foramen ovale: Secondary | ICD-10-CM | POA: Insufficient documentation

## 2019-10-31 DIAGNOSIS — I69354 Hemiplegia and hemiparesis following cerebral infarction affecting left non-dominant side: Secondary | ICD-10-CM

## 2019-10-31 DIAGNOSIS — I2510 Atherosclerotic heart disease of native coronary artery without angina pectoris: Secondary | ICD-10-CM

## 2019-10-31 DIAGNOSIS — I639 Cerebral infarction, unspecified: Secondary | ICD-10-CM

## 2019-10-31 DIAGNOSIS — R0683 Snoring: Secondary | ICD-10-CM
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
## 2019-10-31 NOTE — Patient Instructions (Signed)
Hypersomnia Hypersomnia is a condition in which a person feels very tired during the day even though he or she gets plenty of sleep at night. A person with this condition may take naps during the day and may find it very difficult to wake up from sleep. Hypersomnia may affect a person's ability to think, concentrate, drive, or remember things. What are the causes? The cause of this condition may not be known. Possible causes include:  Certain medicines.  Sleep disorders, such as narcolepsy and sleep apnea.  Injury to the head, brain, or spinal cord.  Drug or alcohol use.  Gastroesophageal reflux disease (GERD).  Tumors.  Certain medical conditions, such as depression, diabetes, or an underactive thyroid gland (hypothyroidism). What are the signs or symptoms? The main symptoms of hypersomnia include:  Feeling very tired throughout the day, regardless of how much sleep you got the night before.  Having trouble waking up. Others may find it difficult to wake you up when you are sleeping.  Sleeping for longer and longer periods at a time.  Taking naps throughout the day. Other symptoms may include:  Feeling restless, anxious, or annoyed.  Lacking energy.  Having trouble with: ? Remembering. ? Speaking. ? Thinking.  Loss of appetite.  Seeing, hearing, tasting, smelling, or feeling things that are not real (hallucinations). How is this diagnosed? This condition may be diagnosed based on:  Your symptoms and medical history.  Your sleeping habits. Your health care provider may ask you to write down your sleeping habits in a daily sleep log, along with any symptoms you have.  A series of tests that are done while you sleep (sleep study or polysomnogram).  A test that measures how quickly you can fall asleep during the day (daytime nap study or multiple sleep latency test). How is this treated? Treatment can help you manage your condition. Treatment may include:   Following a regular sleep routine.  Lifestyle changes, such as changing your eating habits, getting regular exercise, and avoiding alcohol or caffeinated beverages.  Taking medicines to make you more alert (stimulants) during the day.  Treating any underlying medical causes of hypersomnia. Follow these instructions at home: Sleep routine   Schedule the same bedtime and wake-up time each day.  Practice a relaxing bedtime routine. This may include reading, meditation, deep breathing, or taking a warm bath before going to sleep.  Get regular exercise each day. Avoid strenuous exercise in the evening hours.  Keep your sleep environment at a cooler temperature, darkened, and quiet.  Sleep with pillows and a mattress that are comfortable and supportive.  Schedule short 20-minute naps for when you feel sleepiest during the day.  Talk with your employer or teachers about your hypersomnia. If possible, adjust your schedule so that: ? You have a regular daytime work schedule. ? You can take a scheduled nap during the day. ? You do not have to work or be active at night.  Do not eat a heavy meal for a few hours before bedtime. Eat your meals at about the same times every day.  Avoid drinking alcohol or caffeinated beverages. Safety   Do not drive or use heavy machinery if you are sleepy. Ask your health care provider if it is safe for you to drive.  Wear a life jacket when swimming or spending time near water. General instructions  Take supplements and over-the-counter and prescription medicines only as told by your health care provider.  Keep a sleep log that will help   your doctor manage your condition. This may include information about: ? What time you go to bed each night. ? How often you wake up at night. ? How many hours you sleep at night. ? How often and for how long you nap during the day. ? Any observations from others, such as leg movements during sleep, sleep walking,  or snoring.  Keep all follow-up visits as told by your health care provider. This is important. Contact a health care provider if:  You have new symptoms.  Your symptoms get worse. Get help right away if:  You have serious thoughts about hurting yourself or someone else. If you ever feel like you may hurt yourself or others, or have thoughts about taking your own life, get help right away. You can go to your nearest emergency department or call:  Your local emergency services (911 in the [place]  A suicide crisis helpline, such as the [facility] at 574-562-9310. This is open 24 hours a day. Summary  Hypersomnia refers to a condition in which you feel very tired during the day even though you get plenty of sleep at night.  A person with this condition may take naps during the day and may find it very difficult to wake up from sleep.  Hypersomnia may affect a person's ability to think, concentrate, drive, or remember things.  Treatment, such as following a regular sleep routine and making some lifestyle changes, can help you manage your condition. This information is not intended to replace advice given to you by your health care provider. Make sure you discuss any questions you have with your health care provider. Document Released: 11/28/2002 Document Revised: 12/10/2017 Document Reviewed: 12/10/2017 Elsevier Patient Education  [employer]. Sleep Studies A sleep study (polysomnogram) is a series of tests done while you are sleeping. A sleep study records your brain waves, heart rate, breathing rate, oxygen level, and eye and leg movements. A sleep study helps your health care provider:  See how well you sleep.  Diagnose a sleep disorder.  Determine how severe your sleep disorder is.  Create a plan to treat your sleep disorder. Your health care provider may recommend a sleep study if you:  Feel sleepy on most days.  Snore loudly while  sleeping.  Have unusual behaviors while you sleep, such as walking.  Have brief periods in which you stop breathing during sleep (sleepapnea).  Fall asleep suddenly during the day (narcolepsy).  Have trouble falling asleep or staying asleep (insomnia).  Feel like you need to move your legs when trying to fall asleep (restless legs syndrome).  Move your legs by flexing and extending them regularly while asleep (periodic limb movement disorder).  Act out your dreams while you sleep (sleep behavior disorder).  Feel like you cannot move when you first wake up (sleep paralysis). What tests are part of a sleep study? Most sleep studies record the following during sleep:  Brain activity.  Eye movements.  Heart rate and rhythm.  Breathing rate and rhythm.  Blood-oxygen level.  Blood pressure.  Chest and belly movement as you breathe.  Arm and leg movements.  Snoring or other noises.  Body position. Where are sleep studies done? Sleep studies are done at sleep centers. A sleep center may be inside a hospital, office, or clinic. The room where you have the study may look like a hospital room or a hotel room. The health care providers doing the study may come in and out  of the room during the study. Most of the time, they will be in another room monitoring your test as you sleep. How are sleep studies done? Most sleep studies are done during a normal period of time for a full night of sleep. You will arrive at the study center in the evening and go home in the morning. Before the test  Bring your pajamas and toothbrush with you to the sleep study.  Do not have caffeine on the day of your sleep study.  Do not drink alcohol on the day of your sleep study.  Your health care provider will let you know if you should stop taking any of your regular medicines before the test. During the test      Round, sticky patches with sensors attached to recording wires (electrodes) are  placed on your scalp, face, chest, and limbs.  Wires from all the electrodes and sensors run from your bed to a computer. The wires can be taken off and put back on if you need to get out of bed to go to the bathroom.  A sensor is placed over your nose to measure airflow.  A finger clip is put on your finger or ear to measure your blood oxygen level (pulse oximetry).  A belt is placed around your belly and a belt is placed around your chest to measure breathing movements.  If you have signs of the sleep disorder called sleep apnea during your test, you may get a treatment mask to wear for the second half of the night. ? The mask provides positive airway pressure (PAP) to help you breathe better during sleep. This may greatly improve your sleep apnea. ? You will then have all tests done again with the mask in place to see if your measurements and recordings change. After the test  A medical doctor who specializes in sleep will evaluate the results of your sleep study and share them with you and your primary health care provider.  Based on your results, your medical history, and a physical exam, you may be diagnosed with a sleep disorder, such as: ? Sleep apnea. ? Restless legs syndrome. ? Sleep-related behavior disorder. ? Sleep-related movement disorders. ? Sleep-related seizure disorders.  Your health care team will help determine your treatment options based on your diagnosis. This may include: ? Improving your sleep habits (sleep hygiene). ? Wearing a continuous positive airway pressure (CPAP) or bi-level positive airway pressure (BPAP) mask. ? Wearing an oral device at night to improve breathing and reduce snoring. ? Taking medicines. Follow these instructions at home:  Take over-the-counter and prescription medicines only as told by your health care provider.  If you are instructed to use a CPAP or BPAP mask, make sure you use it nightly as directed.  Make any lifestyle  changes that your health care provider recommends.  If you were given a device to open your airway while you sleep, use it only as told by your health care provider.  Do not use any tobacco products, such as cigarettes, chewing tobacco, and e-cigarettes. If you need help quitting, ask your health care provider.  Keep all follow-up visits as told by your health care provider. This is important. Summary  A sleep study (polysomnogram) is a series of tests done while you are sleeping. It shows how well you sleep.  Most sleep studies are done over one full night of sleep. You will arrive at the study center in the evening and go home in  the morning.  If you have signs of the sleep disorder called sleep apnea during your test, you may get a treatment mask to wear for the second half of the night.  A medical doctor who specializes in sleep will evaluate the results of your sleep study and share them with your primary health care provider. This information is not intended to replace advice given to you by your health care provider. Make sure you discuss any questions you have with your health care provider. Document Released: 06/14/2003 Document Revised: 09/24/2018 Document Reviewed: 01/05/2018 Elsevier Patient Education  2020 [employer].

## 2019-11-07 ENCOUNTER — Ambulatory Visit: Payer: Medicaid Other | Admitting: Neurology

## 2019-11-08 LAB — NARCOLEPSY EVALUATION
DQA1*01:02: POSITIVE
DQB1*06:02: POSITIVE

## 2019-11-09 ENCOUNTER — Encounter: Payer: Self-pay | Admitting: Neurology

## 2019-11-14 ENCOUNTER — Ambulatory Visit: Payer: Medicaid Other | Admitting: Neurology

## 2019-11-14 NOTE — Progress Notes (Deleted)
[facility]                                       Cardiology Office Note    Date:  11/14/2019   ID:  Jennifer Ali, DOB [birth year], MRN [id number]  PCP:  Tamsen Roers, MD  Cardiologist: Dr. Burt Knack  CC: 1 month s/p PFO closure   History of Present Illness:  Jennifer Ali is a 48 y.o. female with a history of CVA, ? CAD s/p remote PCI and PFO s/p PFO closure (10/20/19) who presents to clinic for follow up.   She presented in 01/2018 with weakness and numbness down her left arm and leg. She was treated in [facility] and was told that she had a stroke but she was well outside of the treatment window. 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 She underwent bubble study 08/26/19 with LVEF 60-65%, impaired LV relaxation, no significant valvular dysfunction, RV normal size and function, but noted scant immediate contrast passage across atrial septum. TEE 09/06/19 showed a small PFO with predominantly right to left shunting across the atrial septum and that the interatrial septum is aneurysmal.   She underwent successful transcatheter PFO closure with a 25 mm Amplatzer PFO occluder on 10/20/19. Post op echo showed EF 55-60%, normally functioning PFO occluder with no atrial level shunt by color flow doppler.  Today she presents to clinic for follow up.     Past Medical History:  Diagnosis Date  . Anemia   . Borderline personality disorder ([place]) 07/09/2016  . Coronary artery disease   . Dyslipidemia 07/22/2015   Overview:  Low HDL  . Heart attack ([place])   . High cholesterol   . Hypertension   . Hypothyroidism   . Ischemic stroke ([place]) 01/31/2018  . Left leg numbness 11/02/2018  . Left leg pain 11/02/2018  . Major depressive disorder, recurrent episode, severe ([place]) 01/25/2019  . Obesity   . 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 s/p MI Dec. 2005. S/P PCI and CYpher stent LAD  2. No sig. disease by  cath, June,2010  3.Stress echo negative for ischemia at 10 mets in June 2013  . Orthostatic hypotension 07/22/2015  . Patent foramen ovale    (a) by TEE 09/06/19 "small PFO with predominantly R to L shunting across the atrial septum"  . Posttraumatic stress disorder 07/09/2016    Past Surgical History:  Procedure Laterality Date  . ABDOMINAL HYSTERECTOMY    . ANTERIOR CRUCIATE LIGAMENT REPAIR    . BUBBLE STUDY  09/06/2019   Procedure: BUBBLE STUDY;  Surgeon: Skeet Latch, MD;  Location: [facility];  Service: Cardiovascular;;  . CARDIAC CATHETERIZATION    . CORONARY ANGIOPLASTY WITH STENT PLACEMENT    . DILATION AND CURETTAGE OF UTERUS    . PATENT FORAMEN OVALE(PFO) CLOSURE N/A 10/20/2019   Procedure: PATENT FORAMEN OVALE (PFO) CLOSURE;  Surgeon: Sherren Mocha, MD;  Location: [facility];  Service: Cardiovascular;  Laterality: N/A;  . TEE WITHOUT CARDIOVERSION N/A 09/06/2019   Procedure: TRANSESOPHAGEAL ECHOCARDIOGRAM (TEE);  Surgeon: Skeet Latch, MD;  Location: [facility] ENDOSCOPY;  Service: Cardiovascular;  Laterality: N/A;  . TONSILLECTOMY      Current Medications: Outpatient Medications Prior to Visit  Medication Sig Dispense Refill  . amoxicillin (AMOXIL) 500 MG tablet Take 4 tablets (2,000 mg total) by mouth  as directed. Take 4 tablets 1 hour prior to dental work, including routine cleanings for 6 months after PFO closure. 12 tablet 0  . aspirin (ASPIRIN 81) 81 MG EC tablet Take 81 mg by mouth daily.     . chlorthalidone (HYGROTON) 25 MG tablet Take 25 mg by mouth daily.   3  . clopidogrel (PLAVIX) 75 MG tablet Take 1 tablet (75 mg total) by mouth daily. 90 tablet 0  . escitalopram (LEXAPRO) 20 MG tablet Take 20 mg by mouth daily.    Marland Kitchen gabapentin (NEURONTIN) 300 MG capsule Take 1 capsule (300 mg total) by mouth 3 (three) times daily. 90 capsule 11  . HYDROcodone-acetaminophen (NORCO/VICODIN) 5-325 MG tablet Take 1 tablet by mouth daily as needed for pain.    Marland Kitchen  levothyroxine (SYNTHROID, LEVOTHROID) 200 MCG tablet Take 200 mcg by mouth daily before breakfast.   11  . metFORMIN (GLUCOPHAGE) 500 MG tablet Take 500 mg by mouth 2 (two) times daily.    . metoprolol succinate (TOPROL-XL) 100 MG 24 hr tablet Take 100 mg by mouth daily.   3  . potassium chloride SA (KLOR-CON M20) 20 MEQ tablet Take 1 tablet (20 mEq total) by mouth daily. 90 tablet 0  . prazosin (MINIPRESS) 1 MG capsule Take 1 mg by mouth at bedtime.     . rosuvastatin (CRESTOR) 10 MG tablet Take 1 tablet by mouth once daily (Patient taking differently: Take 10 mg by mouth daily. ) 60 tablet 0  . traZODone (DESYREL) 50 MG tablet Take 50 mg by mouth at bedtime.     No facility-administered medications prior to visit.      Allergies:   Sulfa antibiotics   Social History   Socioeconomic History  . Marital status: Married    Spouse name: Not on file  . Number of children: Not on file  . Years of education: Not on file  . Highest education level: Not on file  Occupational History  . Not on file  Social Needs  . Financial resource strain: Not on file  . Food insecurity    Worry: Not on file    Inability: Not on file  . Transportation needs    Medical: Not on file    Non-medical: Not on file  Tobacco Use  . Smoking status: Former Smoker    Packs/day: 0.50    Types: Cigarettes  . Smokeless tobacco: Never Used  Substance and Sexual Activity  . Alcohol use: Not Currently    Comment: occassionally  . Drug use: Not Currently    Types: Marijuana, Methamphetamines, Cocaine  . Sexual activity: Not on file  Lifestyle  . Physical activity    Days per week: Not on file    Minutes per session: Not on file  . Stress: Not on file  Relationships  . Social Musician on phone: Not on file    Gets together: Not on file    Attends religious service: Not on file    Active member of club or organization: Not on file    Attends meetings of clubs or organizations: Not on file     Relationship status: Not on file  Other Topics Concern  . Not on file  Social History Narrative   Lives with mother, father and fmaily   Caffeine use: soda/tea daily   Right handed     Family History:  The patient's ***family history includes Diabetes in her father and mother; Hyperlipidemia in her mother; Hypertension in  her mother; Stroke in her mother.     ROS:   Please see the history of present illness.    ROS All other systems reviewed and are negative.   PHYSICAL EXAM:   VS:  There were no vitals taken for this visit.   GEN: Well nourished, well developed, in no acute distress HEENT: normal Neck: no JVD or masses Cardiac: ***RRR; no murmurs, rubs, or gallops,no edema  Respiratory:  clear to auscultation bilaterally, normal work of breathing GI: soft, nontender, nondistended, + BS MS: no deformity or atrophy Skin: warm and dry, no rash Neuro:  Alert and Oriented x 3, Strength and sensation are intact Psych: euthymic mood, full affect   Wt Readings from Last 3 Encounters:  10/31/19 237 lb (107.5 kg)  10/20/19 235 lb (106.6 kg)  10/07/19 235 lb 9.6 oz (106.9 kg)      Studies/Labs Reviewed:   EKG:  EKG is*** ordered today.  The ekg ordered today demonstrates ***  Recent Labs: 10/07/2019: BUN 17; Creatinine, Ser 1.06; Hemoglobin 13.9; Platelets 289; Potassium 3.5; Sodium 146   Lipid Panel No results found for: CHOL, TRIG, HDL, CHOLHDL, VLDL, LDLCALC, LDLDIRECT  Additional studies/ records that were reviewed today include:   10/20/19 PATENT FORAMEN OVALE (PFO) CLOSURE  Conclusion  Successful transcatheter PFO closure with a 25 mm Amplatzer PFO occluder using intracardiac echo and fluoroscopic guidance    ______________  Echo 10/20/19 IMPRESSIONS  1. There is an interatrial septal occluder device present. No evidence of residual shunting.  2. Left ventricular ejection fraction, by visual estimation, is 55 to 60%. The left ventricle has normal function.  There is no left ventricular hypertrophy.  3. Left atrial size was normal.  4. Right atrial size was normal.  5. The tricuspid valve is grossly normal. Tricuspid valve regurgitation is not demonstrated.  6. The aortic valve is grossly normal. Aortic valve regurgitation is not visualized.  7. Global right ventricle has normal systolic function.The right ventricular size is normal. No increase in right ventricular wall thickness.  8. The mitral valve is grossly normal. No evidence of mitral valve regurgitation.  9. The aortic root was not well visualized. 10. The pulmonic valve was not assessed. Pulmonic valve regurgitation not assessed.   ASSESSMENT & PLAN:   PFO s/p PFO closure:     Medication Adjustments/Labs and Tests Ordered: Current medicines are reviewed at length with the patient today.  Concerns regarding medicines are outlined above.  Medication changes, Labs and Tests ordered today are listed in the Patient Instructions below. There are no Patient Instructions on file for this visit.   Signed, Cline Crock, PA-C  11/14/2019 3:58 PM    [facility] [address] [place], [place], [place]  [address] Phone: [phone]; Fax: [phone]

## 2019-11-23 ENCOUNTER — Encounter: Payer: Self-pay | Admitting: Physician Assistant

## 2019-11-23 ENCOUNTER — Ambulatory Visit: Payer: Medicaid Other | Admitting: Physician Assistant

## 2019-11-23 ENCOUNTER — Other Ambulatory Visit: Payer: Self-pay

## 2019-11-23 ENCOUNTER — Telehealth (INDEPENDENT_AMBULATORY_CARE_PROVIDER_SITE_OTHER): Payer: Medicaid Other | Admitting: Physician Assistant

## 2019-11-23 VITALS — BP 126/84 | Wt 235.0 lb

## 2019-11-23 DIAGNOSIS — Q211 Atrial septal defect: Secondary | ICD-10-CM | POA: Diagnosis not present

## 2019-11-23 DIAGNOSIS — Q2112 Patent foramen ovale: Secondary | ICD-10-CM

## 2019-11-23 NOTE — Patient Instructions (Signed)
Medication Instructions:  1) you may STOP PLAVIX January 29 2) You will need to take AMOXIL 2,000 mg 1 hour before all dental visits for 6 months.   Follow-Up: You will be called to arrange your 1 year echo and office visit.

## 2019-11-23 NOTE — Telephone Encounter (Signed)
This encounter was created in error - please disregard.

## 2019-11-23 NOTE — Progress Notes (Signed)
[facility]   MULTIDISCIPLINARY HEART VALVE TEAM  Virtual Visit via Telephone Note   This visit type was conducted due to national recommendations for restrictions regarding the COVID-19 Pandemic (e.g. social distancing) in an effort to limit this patient's exposure and mitigate transmission in our community.  Due to her co-morbid illnesses, this patient is at least at moderate risk for complications without adequate follow up.  This format is felt to be most appropriate for this patient at this time.  The patient did not have access to video technology/had technical difficulties with video requiring transitioning to audio format only (telephone).  All issues noted in this document were discussed and addressed.  No physical exam could be performed with this format.  Please refer to the patient's chart for her  consent to telehealth for [facility].   Evaluation Performed:  Follow-up visit  Date:  11/23/2019   ID:  Jennifer Ali, Jennifer Ali Dec 25, 1970, MRN [id number]  Patient Location: Home Provider Location: Office  PCP:  Aida Puffer, MD Cardiologist:  Norman Herrlich, MD  Electrophysiologist:  None   Chief Complaint:  1 month s/p PFO closure   History of Present Illness:    Jennifer Ali is a 48 y.o. female with a history of CVA, ? CAD s/p remote PCI and PFO s/p PFO closure (10/20/19) who presents to clinic for follow up.   She presented in 01/2018 with weakness and numbness down her left arm and leg. She was treated in [facility] and was told that she had a stroke but she was well outside of the treatment window. 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 She underwent bubble study 08/26/19 with LVEF 60-65%, impaired LV relaxation, no significant valvular dysfunction, RV normal size and function, but notedscant immediate contrast passage across atrial septum.TEE 09/06/19 showed a small PFO with predominantly right to left shunting across the atrial septum  and that the interatrial septum is aneurysmal.  She underwent successful transcatheter PFO closure with a 25 mm Amplatzer PFO occluder on 10/20/19. Post op echo showed EF 55-60%, normally functioning PFO occluder with no atrial level shunt by color flow doppler.  Today she presents to clinic for follow up. No CP or SOB. No LE edema, orthopnea or PND. No dizziness or syncope. No blood in stool or urine. No palpitations. No numbness, tingling, difficulty speaking.   The patient does not have symptoms concerning for COVID-19 infection (fever, chills, cough, or new shortness of breath).   Past Medical History:  Diagnosis Date  . Anemia   . Borderline personality disorder (HCC) 07/09/2016  . Coronary artery disease   . Dyslipidemia 07/22/2015   Overview:  Low HDL  . Heart attack (HCC)   . High cholesterol   . Hypertension   . Hypothyroidism   . Ischemic stroke (HCC) 01/31/2018  . Left leg numbness 11/02/2018  . Left leg pain 11/02/2018  . Major depressive disorder, recurrent episode, severe (HCC) 01/25/2019  . Obesity   . 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 s/p MI Dec. 2005. S/P PCI and CYpher stent LAD  2. No sig. disease by cath, June,2010  3.Stress echo negative for ischemia at 10 mets in June 2013  . Orthostatic hypotension 07/22/2015  . Patent foramen ovale    (a) by TEE 09/06/19 "small PFO with predominantly R to L shunting across the atrial septum"  . Posttraumatic stress disorder 07/09/2016   Past Surgical History:  Procedure Laterality Date  .  ABDOMINAL HYSTERECTOMY    . ANTERIOR CRUCIATE LIGAMENT REPAIR    . BUBBLE STUDY  09/06/2019   Procedure: BUBBLE STUDY;  Surgeon: Chilton Si, MD;  Location: [facility] ENDOSCOPY;  Service: Cardiovascular;;  . CARDIAC CATHETERIZATION    . CORONARY ANGIOPLASTY WITH STENT PLACEMENT    . DILATION AND CURETTAGE OF UTERUS    . PATENT FORAMEN OVALE(PFO) CLOSURE N/A 10/20/2019   Procedure: PATENT FORAMEN OVALE (PFO) CLOSURE;   Surgeon: Tonny Bollman, MD;  Location: [facility] INVASIVE CV LAB;  Service: Cardiovascular;  Laterality: N/A;  . TEE WITHOUT CARDIOVERSION N/A 09/06/2019   Procedure: TRANSESOPHAGEAL ECHOCARDIOGRAM (TEE);  Surgeon: Chilton Si, MD;  Location: [facility] ENDOSCOPY;  Service: Cardiovascular;  Laterality: N/A;  . TONSILLECTOMY       Current Meds  Medication Sig  . amoxicillin (AMOXIL) 500 MG tablet Take 4 tablets (2,000 mg total) by mouth as directed. Take 4 tablets 1 hour prior to dental work, including routine cleanings for 6 months after PFO closure.  Marland Kitchen aspirin (ASPIRIN 81) 81 MG EC tablet Take 81 mg by mouth daily.   . chlorthalidone (HYGROTON) 25 MG tablet Take 25 mg by mouth daily.   . clopidogrel (PLAVIX) 75 MG tablet Take 1 tablet (75 mg total) by mouth daily.  Marland Kitchen escitalopram (LEXAPRO) 20 MG tablet Take 20 mg by mouth daily.  Marland Kitchen gabapentin (NEURONTIN) 300 MG capsule Take 1 capsule (300 mg total) by mouth 3 (three) times daily.  Marland Kitchen levothyroxine (SYNTHROID, LEVOTHROID) 200 MCG tablet Take 200 mcg by mouth daily before breakfast.   . metoprolol succinate (TOPROL-XL) 100 MG 24 hr tablet Take 100 mg by mouth daily.   . potassium chloride SA (KLOR-CON M20) 20 MEQ tablet Take 1 tablet (20 mEq total) by mouth daily.  . prazosin (MINIPRESS) 1 MG capsule Take 1 mg by mouth at bedtime.   . rosuvastatin (CRESTOR) 10 MG tablet Take 1 tablet by mouth once daily (Patient taking differently: Take 10 mg by mouth daily. )  . traZODone (DESYREL) 50 MG tablet Take 50 mg by mouth at bedtime.     Allergies:   Sulfa antibiotics   Social History   Tobacco Use  . Smoking status: Former Smoker    Packs/day: 0.50    Types: Cigarettes  . Smokeless tobacco: Never Used  Substance Use Topics  . Alcohol use: Not Currently    Comment: occassionally  . Drug use: Not Currently    Types: Marijuana, Methamphetamines, Cocaine     Family Hx: The patient's family history includes Diabetes in her father and mother;  Hyperlipidemia in her mother; Hypertension in her mother; Stroke in her mother.  ROS:   Please see the history of present illness.    All other systems reviewed and are negative.   Prior CV studies:   The following studies were reviewed today:  10/20/19 PATENT FORAMEN OVALE (PFO) CLOSURE  Conclusion  Successful transcatheter PFO closure with a 25 mm Amplatzer PFO occluder using intracardiac echo and fluoroscopic guidance    ______________  Echo 10/20/19 IMPRESSIONS 1. There is an interatrial septal occluder device present. No evidence of residual shunting. 2. Left ventricular ejection fraction, by visual estimation, is 55 to 60%. The left ventricle has normal function. There is no left ventricular hypertrophy. 3. Left atrial size was normal. 4. Right atrial size was normal. 5. The tricuspid valve is grossly normal. Tricuspid valve regurgitation is not demonstrated. 6. The aortic valve is grossly normal. Aortic valve regurgitation is not visualized. 7. Global right ventricle  has normal systolic function.The right ventricular size is normal. No increase in right ventricular wall thickness. 8. The mitral valve is grossly normal. No evidence of mitral valve regurgitation. 9. The aortic root was not well visualized. 10. The pulmonic valve was not assessed. Pulmonic valve regurgitation not assessed.  Labs/Other Tests and Data Reviewed:    EKG:  No ECG reviewed.  Recent Labs: 10/07/2019: BUN 17; Creatinine, Ser 1.06; Hemoglobin 13.9; Platelets 289; Potassium 3.5; Sodium 146   Recent Lipid Panel No results found for: CHOL, TRIG, HDL, CHOLHDL, LDLCALC, LDLDIRECT  Wt Readings from Last 3 Encounters:  11/23/19 235 lb (106.6 kg)  10/31/19 237 lb (107.5 kg)  10/20/19 235 lb (106.6 kg)     Objective:    Vital Signs:  BP 126/84   Wt 235 lb (106.6 kg)   BMI 35.21 kg/m    ASSESSMENT & PLAN:    PFO s/p PFO closure: doing well. Groin site healed. Continue on aspirin  and plavix x 3 months then discontinue Plavix. SBE prophylaxis discussed; I have RX'd amoxicillin. I will see her back in 1 year for follow up with echo w/ bubble.   COVID-19 Education: The signs and symptoms of COVID-19 were discussed with the patient and how to seek care for testing (follow up with PCP or arrange E-visit).  The importance of social distancing was discussed today.  Time:   Today, I have spent 11 minutes with the patient with telehealth technology discussing the above problems.     Medication Adjustments/Labs and Tests Ordered: Current medicines are reviewed at length with the patient today.  Concerns regarding medicines are outlined above.   Tests Ordered: No orders of the defined types were placed in this encounter.   Medication Changes: No orders of the defined types were placed in this encounter.   Disposition:  Follow up in 1 year(s)  Signed, Angelena Form, PA-C  11/23/2019 2:47 PM    [place]

## 2019-11-28 ENCOUNTER — Ambulatory Visit: Payer: Medicaid Other | Admitting: Neurology

## 2019-11-28 ENCOUNTER — Other Ambulatory Visit: Payer: Self-pay

## 2019-11-28 ENCOUNTER — Encounter: Payer: Self-pay | Admitting: Neurology

## 2019-11-28 VITALS — BP 129/87 | HR 60 | Temp 97.2°F | Ht 68.5 in | Wt 237.4 lb

## 2019-11-28 DIAGNOSIS — M79605 Pain in left leg: Secondary | ICD-10-CM

## 2019-11-28 DIAGNOSIS — I639 Cerebral infarction, unspecified: Secondary | ICD-10-CM

## 2019-11-28 NOTE — Patient Instructions (Addendum)
You may try taking gabapentin 300 mg at bedtime to see if that helps with the restless leg symptoms  Keep track of your blood pressure at home, be sure to sit for a few moments before standing, stand slowly  Follow-up with your primary doctor  Follow-up in 6 months

## 2019-11-28 NOTE — Progress Notes (Signed)
PATIENT: Jennifer Ali DOB: [birth year]  REASON FOR VISIT: follow up HISTORY FROM: patient  HISTORY OF PRESENT ILLNESS: Today 11/28/19  HISTORY  Jennifer Ali is a 48 year old female, seen in request by primary care physician Dr. Aida Puffer for evaluation of stroke, initial evaluation was on November 02, 2018.  I have reviewed and summarized the referring note from the referring physician.  She reported a history of hypothyroidism, on supplement, hypertension, coronary artery disease in the past,  In February 2019, she woke up noticed left leg weakness, gradually getting worse over the next few days, to the point of difficulty walking, also clumsiness of her left hand,  She was treated at [facility] under the last name Flossy, Weisner, was diagnosed with stroke, this was followed by rehabilitation, and the left AFO, she came in today with fourfoot cane, wearing left AFO,  she continue complains of gait abnormality,  left low back pain, radiating pain to left lower extremity, frequent left leg muscle spasm, has tried over-the-counter Tylenol ibuprofen without helping,  I reviewed the history from [facility], MRI of the brain in February 2019: Acute right frontal parietal infarction, severe chronic microvascular ischemic changes with remote lacunar infarction.  Multiple foci of some semi confluent of restricted diffusion in the superior right frontal and parietal lobe with associated leptomeningeal enhancement and increased T2/FLAIR signal. There is severe periventricular deep white matter increased T2-FLAIR signal consistent with chronic microvascular ischemic changes. Several old centrum semiovale lacunar infarct. Vessel wall imaging was performed and no vessel wall enhancement is noted. There are no extra-axial fluid collections present.  Intracranial MRA does not demonstrate any aneurysms or high grade stenoses.  Ultrasound of carotid artery: Left  internal carotid artery less than 40% stenosis, right internal carotid artery was normal, subclavian's was normal, both vertebrals were patent with anterograde flow.   MRI of cervical spine: Degenerative disc disease results in severe left neural foraminal narrowing at C4-5 and mild canal stenosis at C5-6.  Laboratory evaluations hemoglobin 12.8, normal free T4 was within normal limits 1.25, A1c 5.3, triglyceride 227, LDL 52.  UPDATE June 01 2019: Patient return for electrodiagnostic study today, which only showed evidence of mild axonal peripheral neuropathy, would not explain her continued left leg weakness, gait abnormality, which is most likely related to the residual deficit from previous stroke, she was found to have mild rigidity of left lower extremity, mild left lower extremity proximal and distal weakness, hyperreflexia, with left side Babinski signs,  She still has not had echocardiogram yet, reported 2 weeks history of Holter monitoring, but do not know the report  Update November 28, 2019 SS: She had PFO closure 10/20/2019.  She is scheduled for sleep evaluation, narcolepsy panel.   Since her stroke, has complained of feeling off balance, if she stands too quickly feels dizzy headed, left leg difficulty, and memory loss.  A few weeks ago she got up to use the bathroom in the middle of the night too quickly, had a fall.  She also complains of restless leg symptoms, mostly in the right leg.  She is taking gabapentin 100 mg 3 times a day, does not notice any problem during the day, only at night. BP standing was 120/80.  She uses a cane for ambulation.  She lives with family.  She remains on Plavix, aspirin, metoprolol.  She denies any other new problems or concerns.   REVIEW OF SYSTEMS: Out of a complete 14 system review of symptoms,  the patient complains only of the following symptoms, and all other reviewed systems are negative.  Memory loss, dizziness  ALLERGIES: Allergies   Allergen Reactions  . Sulfa Antibiotics Hives    HOME MEDICATIONS: Outpatient Medications Prior to Visit  Medication Sig Dispense Refill  . amoxicillin (AMOXIL) 500 MG tablet Take 4 tablets (2,000 mg total) by mouth as directed. Take 4 tablets 1 hour prior to dental work, including routine cleanings for 6 months after PFO closure. 12 tablet 0  . aspirin (ASPIRIN 81) 81 MG EC tablet Take 81 mg by mouth daily.     . chlorthalidone (HYGROTON) 25 MG tablet Take 25 mg by mouth daily.   3  . clopidogrel (PLAVIX) 75 MG tablet Take 1 tablet (75 mg total) by mouth daily. 90 tablet 0  . escitalopram (LEXAPRO) 20 MG tablet Take 20 mg by mouth daily.    Marland Kitchen. gabapentin (NEURONTIN) 300 MG capsule Take 1 capsule (300 mg total) by mouth 3 (three) times daily. 90 capsule 11  . levothyroxine (SYNTHROID, LEVOTHROID) 200 MCG tablet Take 200 mcg by mouth daily before breakfast.   11  . metoprolol succinate (TOPROL-XL) 100 MG 24 hr tablet Take 100 mg by mouth daily.   3  . potassium chloride SA (KLOR-CON M20) 20 MEQ tablet Take 1 tablet (20 mEq total) by mouth daily. 90 tablet 0  . prazosin (MINIPRESS) 1 MG capsule Take 1 mg by mouth at bedtime.     . rosuvastatin (CRESTOR) 10 MG tablet Take 1 tablet by mouth once daily (Patient taking differently: Take 10 mg by mouth daily. ) 60 tablet 0  . traZODone (DESYREL) 50 MG tablet Take 50 mg by mouth at bedtime.     No facility-administered medications prior to visit.     PAST MEDICAL HISTORY: Past Medical History:  Diagnosis Date  . Anemia   . Borderline personality disorder (HCC) 07/09/2016  . Coronary artery disease   . Dyslipidemia 07/22/2015   Overview:  Low HDL  . Heart attack (HCC)   . High cholesterol   . Hypertension   . Hypothyroidism   . Ischemic stroke (HCC) 01/31/2018  . Left leg numbness 11/02/2018  . Left leg pain 11/02/2018  . Major depressive disorder, recurrent episode, severe (HCC) 01/25/2019  . Obesity   . Old MI (myocardial infarction)  07/22/2015   Overview:  1. s/p MI Dec. 2005. S/P PCI and CYpher stent LAD  2. No sig. disease by cath, June,2010  3.Stress echo negative for ischemia at 10 mets in June 2013  . Orthostatic hypotension 07/22/2015  . Patent foramen ovale    (a) by TEE 09/06/19 "small PFO with predominantly R to L shunting across the atrial septum"  . Posttraumatic stress disorder 07/09/2016    PAST SURGICAL HISTORY: Past Surgical History:  Procedure Laterality Date  . ABDOMINAL HYSTERECTOMY    . ANTERIOR CRUCIATE LIGAMENT REPAIR    . BUBBLE STUDY  09/06/2019   Procedure: BUBBLE STUDY;  Surgeon: Chilton Siandolph, Tiffany, MD;  [facility];  Service: Cardiovascular;;  . CARDIAC CATHETERIZATION    . CORONARY ANGIOPLASTY WITH STENT PLACEMENT    . DILATION AND CURETTAGE OF UTERUS    . PATENT FORAMEN OVALE(PFO) CLOSURE N/A 10/20/2019   Procedure: PATENT FORAMEN OVALE (PFO) CLOSURE;  Surgeon: Tonny Bollmanooper, Michael, MD;  [facility];  Service: Cardiovascular;  Laterality: N/A;  . TEE WITHOUT CARDIOVERSION N/A 09/06/2019   Procedure: TRANSESOPHAGEAL ECHOCARDIOGRAM (TEE);  Surgeon: Chilton Siandolph, Tiffany, MD;  [facility];  Service: Cardiovascular;  Laterality: N/A;  . TONSILLECTOMY      FAMILY HISTORY: Family History  Problem Relation Age of Onset  . Stroke Mother   . Diabetes Mother   . Hypertension Mother   . Hyperlipidemia Mother   . Diabetes Father     SOCIAL HISTORY: Social History   Socioeconomic History  . Marital status: Married    Spouse name: Not on file  . Number of children: Not on file  . Years of education: Not on file  . Highest education level: Not on file  Occupational History  . Not on file  Social Needs  . Financial resource strain: Not on file  . Food insecurity    Worry: Not on file    Inability: Not on file  . Transportation needs    Medical: Not on file    Non-medical: Not on file  Tobacco Use  . Smoking status: Former Smoker    Packs/day: 0.50     Types: Cigarettes  . Smokeless tobacco: Never Used  Substance and Sexual Activity  . Alcohol use: Not Currently    Comment: occassionally  . Drug use: Not Currently    Types: Marijuana, Methamphetamines, Cocaine  . Sexual activity: Not on file  Lifestyle  . Physical activity    Days per week: Not on file    Minutes per session: Not on file  . Stress: Not on file  Relationships  . Social Musician on phone: Not on file    Gets together: Not on file    Attends religious service: Not on file    Active member of club or organization: Not on file    Attends meetings of clubs or organizations: Not on file    Relationship status: Not on file  . Intimate partner violence    Fear of current or ex partner: Not on file    Emotionally abused: Not on file    Physically abused: Not on file    Forced sexual activity: Not on file  Other Topics Concern  . Not on file  Social History Narrative   Lives with mother, father and fmaily   Caffeine use: soda/tea daily   Right handed    PHYSICAL EXAM  Vitals:   11/28/19 1034  BP: 129/87  Pulse: 60  Temp: (!) 97.2 F (36.2 C)  TempSrc: Oral  Weight: 237 lb 6.4 oz (107.7 kg)  Height: 5' 8.5" (1.74 m)   Body mass index is 35.57 kg/m.  Generalized: Well developed, in no acute distress   Neurological examination  Mentation: Alert oriented to time, place, history taking. Follows all commands speech and language fluent Cranial nerve II-XII: Pupils were equal round reactive to light. Extraocular movements were full, visual field were full on confrontational test. Facial sensation and strength were normal.  Head turning and shoulder shrug were normal and symmetric. Motor: The motor testing reveals 5 over 5 strength of all 4 extremities. Good symmetric motor tone is noted throughout. Mild left dorsiflexion weakness Sensory: Sensory testing is intact to soft touch on all 4 extremities. No evidence of extinction is noted.  Coordination:  Cerebellar testing reveals good finger-nose-finger and heel-to-shin bilaterally.  Gait and station: Has to push off from seated position to stand, uses a quad cane, gait is unsteady, intentional, some dragging of the left leg Reflexes: Reflexes are symmetric and normal bilaterally.   DIAGNOSTIC DATA (LABS, IMAGING, TESTING) - I reviewed patient records, labs, notes, testing and imaging myself where  available.  Lab Results  Component Value Date   WBC 11.4 (H) 10/07/2019   HGB 13.9 10/07/2019   HCT 40.6 10/07/2019   MCV 94 10/07/2019   PLT 289 10/07/2019      Component Value Date/Time   NA 146 (H) 10/07/2019 1445   K 3.5 10/07/2019 1445   CL 102 10/07/2019 1445   CO2 27 10/07/2019 1445   GLUCOSE 109 (H) 10/07/2019 1445   GLUCOSE 137 (H) 11/28/2018 1835   BUN 17 10/07/2019 1445   CREATININE 1.06 (H) 10/07/2019 1445   CALCIUM 9.1 10/07/2019 1445   GFRNONAA 62 10/07/2019 1445   GFRAA 72 10/07/2019 1445   No results found for: CHOL, HDL, LDLCALC, LDLDIRECT, TRIG, CHOLHDL No results found for: HGBA1C No results found for: VITAMINB12 No results found for: TSH  ASSESSMENT AND PLAN 48 y.o. year old female  has a past medical history of Anemia, Borderline personality disorder ([place]) (07/09/2016), Coronary artery disease, Dyslipidemia (07/22/2015), Heart attack ([place]), High cholesterol, Hypertension, Hypothyroidism, Ischemic stroke ([place]) (01/31/2018), Left leg numbness (11/02/2018), Left leg pain (11/02/2018), Major depressive disorder, recurrent episode, severe ([place]) (01/25/2019), Obesity, Old MI (myocardial infarction) (07/22/2015), Orthostatic hypotension (07/22/2015), Patent foramen ovale, and Posttraumatic stress disorder (07/09/2016). here with:  1.  Stroke in young patient, February 2019 -No significant vascular risk factors, A1c 5.3, LDL was 52 -Continue aspirin daily, remains on Plavix, had PFO closure with Dr. Burt Knack 10/20/2019, will d/c Plavix 3 months post-procedure per cardiology note  -Has complained of dizziness if standing too quickly since stroke, BP standing was 120/80, I encouraged her to monitor BP at home, keep track, sit on side of bed before standing, slow to rise, no other symptoms  -Follow-up with PCP for management of vascular risk factors, monitoring of BP -Follow-up here in 6 months with Dr.Yan or sooner if needed   2.  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 50% of this time was spent discussing her plan of care.  Butler Denmark, AGNP-C, DNP 11/28/2019, 11:07 AM [facility] [address] [place], [place] [address] [phone]

## 2019-11-30 ENCOUNTER — Encounter: Payer: Self-pay | Admitting: *Deleted

## 2019-11-30 ENCOUNTER — Other Ambulatory Visit: Payer: Self-pay

## 2019-11-30 ENCOUNTER — Ambulatory Visit (INDEPENDENT_AMBULATORY_CARE_PROVIDER_SITE_OTHER): Payer: Medicaid Other | Admitting: Cardiology

## 2019-11-30 ENCOUNTER — Encounter: Payer: Self-pay | Admitting: Cardiology

## 2019-11-30 VITALS — BP 104/75 | HR 50 | Ht 68.5 in | Wt 237.4 lb

## 2019-11-30 DIAGNOSIS — Z7189 Other specified counseling: Secondary | ICD-10-CM

## 2019-11-30 DIAGNOSIS — Q211 Atrial septal defect: Secondary | ICD-10-CM | POA: Diagnosis not present

## 2019-11-30 DIAGNOSIS — I10 Essential (primary) hypertension: Secondary | ICD-10-CM

## 2019-11-30 DIAGNOSIS — W19XXXD Unspecified fall, subsequent encounter: Secondary | ICD-10-CM

## 2019-11-30 DIAGNOSIS — Q2112 Patent foramen ovale: Secondary | ICD-10-CM

## 2019-11-30 DIAGNOSIS — E785 Hyperlipidemia, unspecified: Secondary | ICD-10-CM

## 2019-11-30 NOTE — Progress Notes (Signed)
Cardiology Office Note:    Date:  11/30/2019   ID:  Jennifer Ali, DOB Jun 24, 1971, MRN [id number]  PCP:  Tamsen Roers, MD  Cardiologist:  Shirlee More, MD    Referring MD: Tamsen Roers, MD    ASSESSMENT:    1. Patent foramen ovale   2. Essential hypertension   3. Dyslipidemia   4. Fall, subsequent encounter   5. Educated about COVID-19 virus infection    PLAN:    In order of problems listed above:  1. Improved she had closure at 6 months we will stop aspirin I told her I think clopidogrel gives her the best efficacy and safety profile. 2. Worsened BP is relatively low she takes a small strong alpha-blocker characterized by orthostatic hypotension and she will stop.  If the episodes persist she will need further evaluation perhaps even a loop recorder. 3. Stable continue high intensity statin will access her recent labs 4. Secondary to orthostatic hypotension stop Minipress 5. With high prevalence of COVID-19 I asked her to start to wear medical eye protectionespecially and commercial buildings.   Next appointment: 1 year   Medication Adjustments/Labs and Tests Ordered: Current medicines are reviewed at length with the patient today.  Concerns regarding medicines are outlined above.  No orders of the defined types were placed in this encounter.  No orders of the defined types were placed in this encounter.   Chief Complaint  Patient presents with  . Follow-up    History of Present Illness:    Jennifer Ali is a 48 y.o. female with a hx of CVA, ? CAD s/p remote PCI and PFO s/p PFO closure (10/20/19)  last seen 11/23/2019 virtual visit. She presented in 01/2018 with weakness and numbness down her left arm and leg. She was treated in [facility] and was told that she had a stroke but she was well outside of the treatment window. 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 She underwent bubble study 08/26/19 with LVEF 60-65%, impaired LV relaxation, no  significant valvular dysfunction, RV normal size and function, but noted scant immediate contrast passage across atrial septum. TEE 09/06/19 showed a small PFO with predominantly right to left shunting across the atrial septum and that the interatrial septum is aneurysmal.  She underwent successful transcatheter PFO closure with a 25 mm Amplatzer PFO occluder on 10/20/19. Post op echo showed EF 55-60%, normally functioning PFO occluder with no atrial level shunt by color flow Doppler  Compliance with diet, lifestyle and medications: Yes  Overall she is doing well she is very reassured that her PFO was closed and will plan at 6 months of dropping aspirin or remaining on clopidogrel.  She has no bleeding complication.  She has had episodes where she gets out of bed and finds her self on the floor without loss of consciousness she takes a strong alpha-blocker and I told her to stop as her blood pressure is relatively low I do not think she needs to wear another ambulatory heart rhythm monitor or repeat a neurologic evaluation.  No edema chest pain shortness of breath or syncope.  She assures me she had recent labs at her PCP including lipids and she is at [facility] and I requested a copy Past Medical History:  Diagnosis Date  . Anemia   . Borderline personality disorder ([place]) 07/09/2016  . Coronary artery disease   . Dyslipidemia 07/22/2015   Overview:  Low HDL  . Heart attack ([place])   . High cholesterol   .  Hypertension   . Hypothyroidism   . Ischemic stroke (HCC) 01/31/2018  . Left leg numbness 11/02/2018  . Left leg pain 11/02/2018  . Major depressive disorder, recurrent episode, severe (HCC) 01/25/2019  . Obesity   . 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 s/p MI Dec. 2005. S/P PCI and CYpher stent LAD  2. No sig. disease by cath, June,2010  3.Stress echo negative for ischemia at 10 mets in June 2013  . Orthostatic hypotension 07/22/2015  . Patent foramen ovale    (a) by TEE 09/06/19  "small PFO with predominantly R to L shunting across the atrial septum"  . Posttraumatic stress disorder 07/09/2016    Past Surgical History:  Procedure Laterality Date  . ABDOMINAL HYSTERECTOMY    . ANTERIOR CRUCIATE LIGAMENT REPAIR    . BUBBLE STUDY  09/06/2019   Procedure: BUBBLE STUDY;  Surgeon: Chilton Si, MD;  Location: [facility] ENDOSCOPY;  Service: Cardiovascular;;  . CARDIAC CATHETERIZATION    . CORONARY ANGIOPLASTY WITH STENT PLACEMENT    . DILATION AND CURETTAGE OF UTERUS    . PATENT FORAMEN OVALE(PFO) CLOSURE N/A 10/20/2019   Procedure: PATENT FORAMEN OVALE (PFO) CLOSURE;  Surgeon: Tonny Bollman, MD;  Location: [facility] INVASIVE CV LAB;  Service: Cardiovascular;  Laterality: N/A;  . TEE WITHOUT CARDIOVERSION N/A 09/06/2019   Procedure: TRANSESOPHAGEAL ECHOCARDIOGRAM (TEE);  Surgeon: Chilton Si, MD;  Location: [facility] ENDOSCOPY;  Service: Cardiovascular;  Laterality: N/A;  . TONSILLECTOMY      Current Medications: Current Meds  Medication Sig  . amoxicillin (AMOXIL) 500 MG tablet Take 4 tablets (2,000 mg total) by mouth as directed. Take 4 tablets 1 hour prior to dental work, including routine cleanings for 6 months after PFO closure.  Marland Kitchen aspirin (ASPIRIN 81) 81 MG EC tablet Take 81 mg by mouth daily.   . chlorthalidone (HYGROTON) 25 MG tablet Take 25 mg by mouth daily.   . clopidogrel (PLAVIX) 75 MG tablet Take 1 tablet (75 mg total) by mouth daily.  Marland Kitchen escitalopram (LEXAPRO) 20 MG tablet Take 20 mg by mouth daily.  Marland Kitchen gabapentin (NEURONTIN) 300 MG capsule Take 1 capsule (300 mg total) by mouth 3 (three) times daily.  Marland Kitchen levothyroxine (SYNTHROID, LEVOTHROID) 200 MCG tablet Take 200 mcg by mouth daily before breakfast.   . metoprolol succinate (TOPROL-XL) 100 MG 24 hr tablet Take 100 mg by mouth daily.   . potassium chloride SA (KLOR-CON M20) 20 MEQ tablet Take 1 tablet (20 mEq total) by mouth daily.  . rosuvastatin (CRESTOR) 10 MG tablet Take 1 tablet by mouth once daily (Patient  taking differently: Take 10 mg by mouth daily. )  . traZODone (DESYREL) 50 MG tablet Take 50 mg by mouth at bedtime.  . [DISCONTINUED] prazosin (MINIPRESS) 1 MG capsule Take 1 mg by mouth at bedtime.      Allergies:   Sulfa antibiotics   Social History   Socioeconomic History  . Marital status: Married    Spouse name: Not on file  . Number of children: Not on file  . Years of education: Not on file  . Highest education level: Not on file  Occupational History  . Not on file  Social Needs  . Financial resource strain: Not on file  . Food insecurity    Worry: Not on file    Inability: Not on file  . Transportation needs    Medical: Not on file    Non-medical: Not on file  Tobacco Use  . Smoking status:  Former Smoker    Packs/day: 0.50    Types: Cigarettes  . Smokeless tobacco: Never Used  Substance and Sexual Activity  . Alcohol use: Not Currently    Comment: occassionally  . Drug use: Not Currently    Types: Marijuana, Methamphetamines, Cocaine  . Sexual activity: Not on file  Lifestyle  . Physical activity    Days per week: Not on file    Minutes per session: Not on file  . Stress: Not on file  Relationships  . Social Musician on phone: Not on file    Gets together: Not on file    Attends religious service: Not on file    Active member of club or organization: Not on file    Attends meetings of clubs or organizations: Not on file    Relationship status: Not on file  Other Topics Concern  . Not on file  Social History Narrative   Lives with mother, father and fmaily   Caffeine use: soda/tea daily   Right handed     Family History: The patient's family history includes Diabetes in her father and mother; Hyperlipidemia in her mother; Hypertension in her mother; Stroke in her mother. ROS:   Please see the history of present illness.    All other systems reviewed and are negative.  EKGs/Labs/Other Studies Reviewed:    The following studies were  reviewed today:    Recent Labs: 10/07/2019: BUN 17; Creatinine, Ser 1.06; Hemoglobin 13.9; Platelets 289; Potassium 3.5; Sodium 146  Recent Lipid Panel No results found for: CHOL, TRIG, HDL, CHOLHDL, VLDL, LDLCALC, LDLDIRECT  Physical Exam:    VS:  BP 104/75 (BP Location: Right Arm)   Pulse (!) 50   Ht 5' 8.5" (1.74 m)   Wt 237 lb 6.4 oz (107.7 kg)   SpO2 97%   BMI 35.57 kg/m     Wt Readings from Last 3 Encounters:  11/30/19 237 lb 6.4 oz (107.7 kg)  11/28/19 237 lb 6.4 oz (107.7 kg)  11/23/19 235 lb (106.6 kg)     GEN:  Well nourished, well developed in no acute distress HEENT: Normal NECK: No JVD; No carotid bruits LYMPHATICS: No lymphadenopathy CARDIAC: RRR, no murmurs, rubs, gallops RESPIRATORY:  Clear to auscultation without rales, wheezing or rhonchi  ABDOMEN: Soft, non-tender, non-distended MUSCULOSKELETAL:  No edema; No deformity  SKIN: Warm and dry NEUROLOGIC:  Alert and oriented x 3 PSYCHIATRIC:  Normal affect    Signed, Norman Herrlich, MD  11/30/2019 1:44 PM    [facility]

## 2019-11-30 NOTE — Patient Instructions (Addendum)
Medication Instructions:  Your physician has recommended you make the following change in your medication:  STOP minipress now  STOP aspirin on 04/21/2020  *If you need a refill on your cardiac medications before your next appointment, please call your pharmacy*  Lab Work: None  If you have labs (blood work) drawn today and your tests are completely normal, you will receive your results only by: Marland Kitchen MyChart Message (if you have MyChart) OR . A paper copy in the mail If you have any lab test that is abnormal or we need to change your treatment, we will call you to review the results.  Testing/Procedures: None  Follow-Up: At [facility], you and your health needs are our priority.  As part of our continuing mission to provide you with exceptional heart care, we have created designated Provider Care Teams.  These Care Teams include your primary Cardiologist (physician) and Advanced Practice Providers (APPs -  Physician Assistants and Nurse Practitioners) who all work together to provide you with the care you need, when you need it.  Your next appointment:   1 year(s)  The format for your next appointment:   In Person  Provider:   Shirlee More, MD      Purchase on line at [facility] or at [facility]

## 2019-12-04 ENCOUNTER — Ambulatory Visit (INDEPENDENT_AMBULATORY_CARE_PROVIDER_SITE_OTHER): Payer: Medicaid Other | Admitting: Neurology

## 2019-12-04 DIAGNOSIS — I2510 Atherosclerotic heart disease of native coronary artery without angina pectoris: Secondary | ICD-10-CM

## 2019-12-04 DIAGNOSIS — I252 Old myocardial infarction: Secondary | ICD-10-CM

## 2019-12-04 DIAGNOSIS — R0683 Snoring: Secondary | ICD-10-CM

## 2019-12-04 DIAGNOSIS — Q211 Atrial septal defect: Secondary | ICD-10-CM

## 2019-12-04 DIAGNOSIS — G4719 Other hypersomnia: Secondary | ICD-10-CM

## 2019-12-04 DIAGNOSIS — I639 Cerebral infarction, unspecified: Secondary | ICD-10-CM

## 2019-12-04 DIAGNOSIS — G4753 Recurrent isolated sleep paralysis: Secondary | ICD-10-CM

## 2019-12-04 DIAGNOSIS — G4733 Obstructive sleep apnea (adult) (pediatric): Secondary | ICD-10-CM

## 2019-12-04 DIAGNOSIS — R442 Other hallucinations: Secondary | ICD-10-CM

## 2019-12-04 DIAGNOSIS — Q2112 Patent foramen ovale: Secondary | ICD-10-CM

## 2019-12-04 DIAGNOSIS — I69354 Hemiplegia and hemiparesis following cerebral infarction affecting left non-dominant side: Secondary | ICD-10-CM

## 2019-12-04 DIAGNOSIS — G4711 Idiopathic hypersomnia with long sleep time: Secondary | ICD-10-CM

## 2019-12-13 ENCOUNTER — Encounter: Payer: Self-pay | Admitting: Neurology

## 2019-12-13 ENCOUNTER — Telehealth: Payer: Self-pay | Admitting: Neurology

## 2019-12-13 NOTE — Progress Notes (Signed)
Loud (very loud!) Snoring was noted. EKG was in keeping with  normal sinus rhythm (NSR).    IMPRESSION:   1. Mild Obstructive Sleep Apnea (OSA) at AHI of 6.6/h without REM  sleep accentuation.  2. There was prolonged hypoxemia noted, to the length of over 4  minutes en bloc repeatedly during N3 sleep.   3. Mild PLMD /Periodic Limb Movement Disorder.  4. Thunderous Primary Snoring  RECOMMENDATIONS:   1. Advise full-night, attended, CPAP titration study to optimize  therapy. This should also serve the possible use of Oxygen to  alleviated hypoxemia.  2. We may also see a correlation of hypoxemia and PLMs.  NEED to show if oxygen is needed- CPAP/BiPAP titration.  Please send a result note to PCP, Tamsen Roers, MD

## 2019-12-13 NOTE — Telephone Encounter (Signed)
Called patient to discuss sleep study results. No answer at this time. LVM for the patient to call back.   I will send a mychart message as well.  

## 2019-12-13 NOTE — Telephone Encounter (Signed)
-----   Message from Larey Seat, MD sent at 12/13/2019 12:39 PM EST ----- Loud (very loud!) Snoring was noted. EKG was in keeping with  normal sinus rhythm (NSR).    IMPRESSION:   1. Mild Obstructive Sleep Apnea (OSA) at AHI of 6.6/h without REM  sleep accentuation.  2. There was prolonged hypoxemia noted, to the length of over 4  minutes en bloc repeatedly during N3 sleep.   3. Mild PLMD /Periodic Limb Movement Disorder.  4. Thunderous Primary Snoring  RECOMMENDATIONS:   1. Advise full-night, attended, CPAP titration study to optimize  therapy. This should also serve the possible use of Oxygen to  alleviated hypoxemia.  2. We may also see a correlation of hypoxemia and PLMs.  NEED to show if oxygen is needed- CPAP/BiPAP titration.  Please send a result note to PCP, Tamsen Roers, MD

## 2019-12-13 NOTE — Procedures (Signed)
PATIENT'S NAME:  Jennifer Ali, Mangels DOB:      February 02, 1971      MR#:    [id number]     DATE OF RECORDING: 12/04/2019 REFERRING M.D.:  Dene Gentry and Aida Puffer, MD Study Performed:   Polysomnogram with expanded EEG montage- Seizure montage HISTORY:  Jennifer Ali , seen on 10-31-2019,is a right- handed Caucasian female with a medical history of Anemia, Borderline personality disorder (HCC) (07/09/2016), Coronary artery disease, Dyslipidemia (07/22/2015), Hypothyroidism, Ischemic stroke (HCC) (01/31/2018), CVA with Left leg numbness (11/02/2018), Left leg pain (11/02/2018), Major depressive disorder, recurrent episode, severe (HCC) (01/25/2019), Obesity,  MI (myocardial infarction) (07/22/2015), Orthostatic hypotension (07/22/2015), Patent foramen ovale, and Posttraumatic stress disorder (07/09/2016).   Jennifer Ali is a 48 year old female patient of Dr Levert Feinstein, MD  I have reviewed and summarized the referring note from the referring physician.  She reported a history of hypothyroidism, on supplement, hypertension, coronary artery disease in the past, In February 2019, she woke up noticed left leg weakness, gradually getting worse over the next few days, to the point of difficulty walking, also clumsiness of her left hand, She was treated at [facility] under the name Ruie, Sendejo, and was diagnosed with stroke, this was followed by rehabilitation, wearing left AFO, PFO closure was performed at [facility] on 10-20-19. Now here for sleep study to evaluate causes of stroke.    The patient endorsed the Epworth Sleepiness Scale at 20/24 points.   The patient's weight 237 pounds with a height of 68 (inches), resulting in a BMI of 35.7 kg/m2. The patient's neck circumference measured 18 inches.  CURRENT MEDICATIONS: Amoxil, Aspirin, Hygroton, Plavix, Lexapro, Neurontin, Norco, Synthroid, Glucophage, Toprol, klor-con, Minipress, Crestor, Desyrel.   PROCEDURE:  This is a  multichannel digital polysomnogram utilizing the Somnostar 11.2 system.  Electrodes and sensors were applied and monitored per AASM Specifications.   EEG, EOG, Chin and Limb EMG, were sampled at 200 Hz.  ECG, Snore and Nasal Pressure, Thermal Airflow, Respiratory Effort, CPAP Flow and Pressure, Oximetry was sampled at 50 Hz. Digital video and audio were recorded.      BASELINE STUDY : Lights Out was at 21:22 and Lights On at 04:55.  Total recording time (TRT) was 453.5 minutes, with a total sleep time (TST) of 371.5 minutes.   The patient's sleep latency was 31 minutes.  REM latency was 374.5 minutes.  The sleep efficiency was 81.9 %.     SLEEP ARCHITECTURE: WASO (Wake after sleep onset) was 50 minutes.  There were 4 minutes in Stage N1, 205 minutes Stage N2, 115.5 minutes Stage N3 and 47 minutes in Stage REM.  The percentage of Stage N1 was 1.1%, Stage N2 was 55.2%, Stage N3 was 31.1% and Stage R (REM sleep) was 12.7%.     RESPIRATORY ANALYSIS:  There were a total of 41 respiratory events:  0 obstructive apneas, 0 central apneas and 0 mixed apneas with a total of 0 apneas and an apnea index (AI) of 0 /hour. There were 41 hypopneas with a hypopnea index of 6.6 /hour. The patient also had 0 respiratory event related arousals (RERAs).      The total APNEA/HYPOPNEA INDEX (AHI) was 6.6/hour and the total RESPIRATORY DISTURBANCE INDEX was  6.6 /hour.  2 events occurred in REM sleep and 78 events in NREM. The REM AHI was  2.6 /hour, versus a non-REM AHI of 7.2/h. The patient spent 181.5 minutes of total sleep time in the supine position and  190 minutes in non-supine. The supine sleep position AHI was 2.3/h versus a non-supine AHI of 10.7/h.  OXYGEN SATURATION & C02:  The Wake baseline 02 saturation was 92%, with the lowest being 72%. Time spent below 89% saturation equaled 152 minutes. I attached 2 screenshots covering each 4 minutes of  hypoxemia duration.  The arousals were noted as: 98 were spontaneous,  19 were associated with PLMs, 27 were associated with respiratory events. The patient had a total of 127 Periodic Limb Movements.  The Periodic Limb Movement (PLM) Arousal index was 3.1/hour.  Sleep architecture was unusual for a high proportion of sleep wave sleep/ N3. Were sedatives used before sleep time? Audio and video analysis did not show any abnormal or unusual movements, behaviors, phonations or vocalizations. The patient took one bathroom break.  Loud (very loud!) Snoring was noted. EKG was in keeping with normal sinus rhythm (NSR).   IMPRESSION:  1. Mild Obstructive Sleep Apnea (OSA) at AHI of 6.6/h without REM sleep accentuation. 2. There was prolonged hypoxemia noted, to the length of over 4 minutes en bloc repeatedly during N3 sleep.    3. Mild PLMD /Periodic Limb Movement Disorder. 4. Thunderous Primary Snoring RECOMMENDATIONS:  1. Advise full-night, attended, CPAP titration study to optimize therapy.  This should also serve the possible use of Oxygen to alleviated hypoxemia. 2. We may also see a correlation of hypoxemia and PLMs.    I certify that I have reviewed the entire raw data recording prior to the issuance of this report in accordance with the Standards of Accreditation of the American Academy of Sleep Medicine (AASM) Larey Seat, MD Diplomat, American Board of Psychiatry and Neurology  Diplomat, American Board of Sleep Medicine Market researcher, [place] [facility] [employer]

## 2019-12-13 NOTE — Addendum Note (Signed)
Addended by: Larey Seat on: 12/13/2019 12:37 PM   Modules accepted: Orders

## 2020-01-03 ENCOUNTER — Other Ambulatory Visit: Payer: Self-pay

## 2020-01-03 ENCOUNTER — Other Ambulatory Visit: Payer: Self-pay | Admitting: Cardiology

## 2020-01-03 DIAGNOSIS — I639 Cerebral infarction, unspecified: Secondary | ICD-10-CM

## 2020-01-03 MED ORDER — ROSUVASTATIN CALCIUM 10 MG PO TABS: 10.0000 mg | ORAL_TABLET | Freq: Every day | ORAL | 2 refills | Status: AC

## 2020-01-03 MED ORDER — POTASSIUM CHLORIDE CRYS ER 20 MEQ PO TBCR: 20.0000 meq | EXTENDED_RELEASE_TABLET | Freq: Every day | ORAL | 2 refills | Status: DC

## 2020-01-03 MED ORDER — CLOPIDOGREL BISULFATE 75 MG PO TABS: 75.0000 mg | ORAL_TABLET | Freq: Every day | ORAL | 3 refills | Status: AC

## 2020-01-14 ENCOUNTER — Other Ambulatory Visit (HOSPITAL_COMMUNITY)
Admission: RE | Admit: 2020-01-14 | Discharge: 2020-01-14 | Disposition: A | Discharge status: Home / Self Care | Payer: Medicaid Other | Source: Ambulatory Visit | Attending: Neurology | Admitting: Neurology

## 2020-01-14 DIAGNOSIS — Z01812 Encounter for preprocedural laboratory examination: Secondary | ICD-10-CM | POA: Insufficient documentation

## 2020-01-14 DIAGNOSIS — Z20822 Contact with and (suspected) exposure to covid-19: Secondary | ICD-10-CM | POA: Insufficient documentation

## 2020-01-15 LAB — SARS CORONAVIRUS 2 (TAT 6-24 HRS): SARS Coronavirus 2: NEGATIVE

## 2020-01-15 NOTE — Progress Notes (Signed)
Negative covid test

## 2020-01-16 ENCOUNTER — Ambulatory Visit (INDEPENDENT_AMBULATORY_CARE_PROVIDER_SITE_OTHER): Payer: Medicaid Other | Admitting: Neurology

## 2020-01-16 DIAGNOSIS — G4711 Idiopathic hypersomnia with long sleep time: Secondary | ICD-10-CM

## 2020-01-16 DIAGNOSIS — G4733 Obstructive sleep apnea (adult) (pediatric): Secondary | ICD-10-CM

## 2020-01-16 DIAGNOSIS — I2510 Atherosclerotic heart disease of native coronary artery without angina pectoris: Secondary | ICD-10-CM

## 2020-01-16 DIAGNOSIS — R442 Other hallucinations: Secondary | ICD-10-CM

## 2020-01-16 DIAGNOSIS — Q2112 Patent foramen ovale: Secondary | ICD-10-CM

## 2020-01-16 DIAGNOSIS — G4719 Other hypersomnia: Secondary | ICD-10-CM

## 2020-01-16 DIAGNOSIS — Q211 Atrial septal defect: Secondary | ICD-10-CM

## 2020-01-26 NOTE — Procedures (Signed)
PATIENT'S NAME:  Jennifer Ali, Jennifer Ali DOB:      1971/05/03      MR#:    [id number]     DATE OF RECORDING: 01/16/2020  [facility] REFERRING M.D.:  Tamsen Roers, MD Study Performed:   Titration to positive airway pressure   Jennifer Ali returned for a CPAP Titration following her PSG on 12/03/20 which revealed mild apnea. Patient had an AHI of 6.6/h, non-supine AHI of 10.7 and prolonged hypoxemia for up to 4 minutes en bloc. Thunderous snoring accompanied the sleep study.   The patient endorsed the Epworth Sleepiness Scale at 20/24 points.   The patient's weight 237 pounds with a height of 68.5 (inches), resulting in a BMI of 36.1 kg/m2. The patient's neck circumference measured 18 inches.  CURRENT MEDICATIONS: Amoxil, Aspirin, Hygroton, Plavix, Lexapro, Neurontin, Norco, Synthroid, Glucophage, Toprol, klor-con, Minipress, Crestor, Desyrel.    PROCEDURE:  This is a multichannel digital polysomnogram utilizing the SomnoStar 11.2 system.  Electrodes and sensors were applied and monitored per AASM Specifications.   EEG, EOG, Chin and Limb EMG, were sampled at 200 Hz.  ECG, Snore and Nasal Pressure, Thermal Airflow, Respiratory Effort, CPAP Flow and Pressure, Oximetry was sampled at 50 Hz. Digital video and audio were recorded.      CPAP was initiated under a F and P Vitera FFM in medium size, at 5 cmH20 with heated humidity per AASM split night standards >the pressure was gradually advanced to 11 cmH20 because of hypopneas, apneas and desaturations.  At a PAP pressure of 11 cmH20, there was a reduction of the AHI to 0.6/h  with improvement of sleep apnea.  Lights Out was at 21:29 and Lights On at 04:59. Total recording time (TRT) was 451 minutes, with a total sleep time (TST) of 349 minutes. The patient's sleep latency was 67 minutes. REM latency was 0 minutes.  The sleep efficiency was 77.4 %.    SLEEP ARCHITECTURE: WASO (Wake after sleep onset) was 34.5 minutes.  There were 2.5 minutes in Stage N1,  130 minutes Stage N2, 216.5 minutes Stage N3 and 0 minutes in Stage REM.  The percentage of Stage N1 was 0.7%, Stage N2 was 37.2%, Stage N3 was 62.0% and Stage R (REM sleep) was 0%. 0The sleep architecture was notably anormal for such high proportion of SWS , N3.  The arousals were noted as: 43 were spontaneous, 0 were associated with PLMs, 10 were associated with respiratory events. RESPIRATORY ANALYSIS:  There was a total of 17 respiratory events: 1 obstructive apneas\, 1 central apneas\ and 0 mixed apneas with 15 hypopneas .     The total APNEA/HYPOPNEA INDEX  (AHI) was 2.9 /hour -17 events in NREM. The REM AHI was 0 /hour versus a non-REM AHI of 2.9 /hour.  The patient spent 263 minutes of total sleep time in the supine position and 86 minutes in non-supine. The supine AHI was 3.7, versus a non-supine AHI of 0.7.  OXYGEN SATURATION & C02:  The baseline 02 saturation was 90%, with the lowest being 81%. Time spent below 89% saturation equaled 22 minutes. The arousals were noted as: 43 were spontaneous, 0 were associated with PLMs, 10 were associated with respiratory events. The patient had a total of 0 Periodic Limb Movements. The Periodic Limb Movement (PLM) index was 0 and the PLM Arousal index was 0 /hour. Audio and video analysis did not show any abnormal or unusual movements, behaviors, phonations or vocalizations.   Snoring was noted up to 7 m water  pressure CPAP. EKG was in keeping with normal sinus rhythm.    DIAGNOSIS Mild Obstructive Sleep Apnea was controlled at 11 cm CPAP. The patient used a F&P Vitera Medium full face mask. 1. Sleep architecture was notable for high proportion of slow wave sleep, N3 sleep.   PLANS/RECOMMENDATIONS: 1. All sleep apnea patients are asked to avoid sedatives, hypnotics, and alcoholic beverage consumption.   DISCUSSION: Start auto CPAP with heated humidity, between 7 and 15 cm water, 3 cm RPR.  I will prescribe the F&P Vitera Medium full face  mask.   A follow up appointment will be scheduled in the Sleep Clinic at [facility].   Please call [phone] with any questions.      I certify that I have reviewed the entire raw data recording prior to the issuance of this report in accordance with the Standards of Accreditation of the American Academy of Sleep Medicine (AASM)   Melvyn Novas, M.D. Diplomat, Biomedical engineer of Psychiatry and Neurology  Diplomat, Biomedical engineer of Sleep Medicine Wellsite geologist, [employer] [facility] [employer]

## 2020-01-26 NOTE — Progress Notes (Signed)
  DIAGNOSIS Mild Obstructive Sleep Apnea was controlled at 11 cm CPAP. The patient used a F&P Vitera Medium full face mask. 1. Sleep architecture was notable for high proportion of slow wave sleep, N3 sleep.   PLANS/RECOMMENDATIONS: 1. All sleep apnea patients are asked to avoid sedatives, hypnotics, and alcoholic beverage consumption.   DISCUSSION: Start auto CPAP with heated humidity, between 7 and 15 cm water, 3 cm RPR.  I will prescribe the F&P Vitera Medium full face mask.

## 2020-01-26 NOTE — Addendum Note (Signed)
Addended by: Melvyn Novas on: 01/26/2020 04:57 PM   Modules accepted: Orders

## 2020-01-30 ENCOUNTER — Telehealth: Payer: Self-pay | Admitting: Neurology

## 2020-01-30 NOTE — Telephone Encounter (Signed)
-----   Message from Melvyn Novas, MD sent at 01/26/2020  4:57 PM EST -----  DIAGNOSIS Mild Obstructive Sleep Apnea was controlled at 11 cm CPAP. The patient used a F&P Vitera Medium full face mask. 1. Sleep architecture was notable for high proportion of slow wave sleep, N3 sleep.   PLANS/RECOMMENDATIONS: 1. All sleep apnea patients are asked to avoid sedatives, hypnotics, and alcoholic beverage consumption.   DISCUSSION: Start auto CPAP with heated humidity, between 7 and 15 cm water, 3 cm RPR.  I will prescribe the F&P Vitera Medium full face mask.

## 2020-01-30 NOTE — Telephone Encounter (Signed)
I called pt. I advised pt that Dr. Vickey Huger reviewed their sleep study results and found that pt was best treated with CPAP at pressure of 11 cm water pressure. Dr. Vickey Huger recommends that pt starts auto CPAP. I reviewed PAP compliance expectations with the pt. Pt is agreeable to starting a CPAP. I advised pt that an order will be sent to a DME, [facility], and [facility] will call the pt within about one week after they file with the pt's insurance. [facility] will show the pt how to use the machine, fit for masks, and troubleshoot the CPAP if needed. A follow up appt was made for insurance purposes with Butch Penny on April 5,2021 at 11 am. Pt verbalized understanding to arrive 15 minutes early and bring their CPAP. A letter with all of this information in it will be mailed to the pt as a reminder. I verified with the pt that the address we have on file is correct. Pt verbalized understanding of results. Pt had no questions at this time but was encouraged to call back if questions arise. I have sent the order to [facility] and have received confirmation that they have received the order.

## 2020-02-22 NOTE — Progress Notes (Signed)
I have reviewed and agreed above plan. 

## 2020-02-27 IMAGING — MR MR HEAD W/O CM
10 of 11 series · 42 of 48 positions shown · non-contrast
Comparison: None.

CLINICAL DATA: 46 y/o F; left shoulder and arm pain that started
[REDACTED]. History of stroke in [REDACTED].

EXAM:
MRI HEAD WITHOUT CONTRAST
TECHNIQUE: Multiplanar, multiecho pulse sequences of the brain and surrounding
structures were obtained without intravenous contrast.

[Series 5: DWI · axial · 3.0mm · 0.88mm/px · z∈[-35,+99]mm · 9 of 92 slices shown (1 of 4)]
[im 1/92]
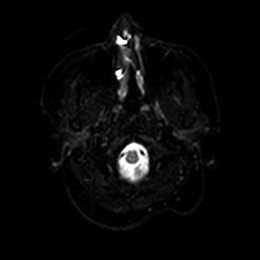
[im 12/92]
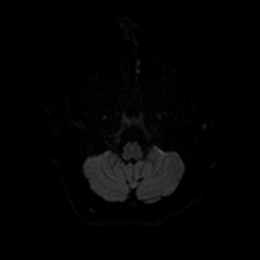
[im 23/92]
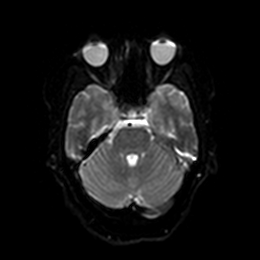
[im 35/92]
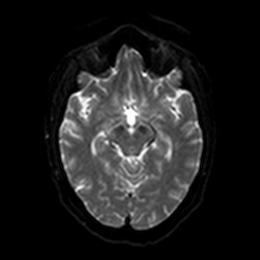
[im 46/92]
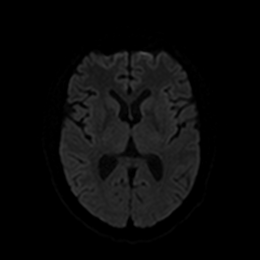
[im 57/92]
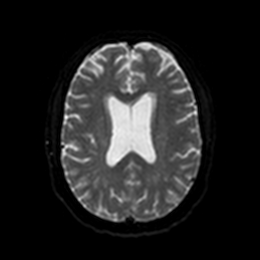
[im 69/92]
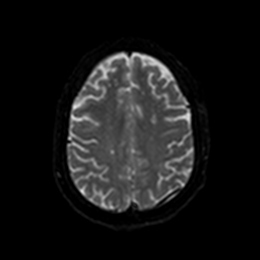
[im 80/92]
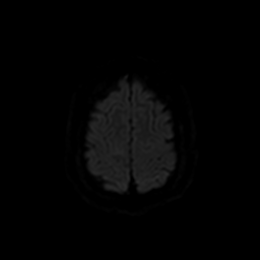
[im 92/92]
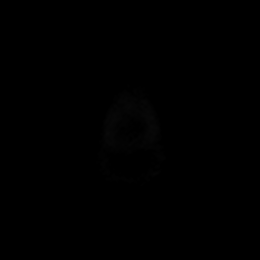

[Series 6: DWI · axial · 3.0mm · 0.88mm/px · z∈[-35,+99]mm · 4 of 46 slices shown (2 of 4)]
[im 1/46]
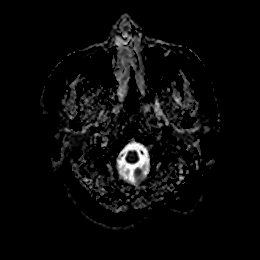
[im 16/46]
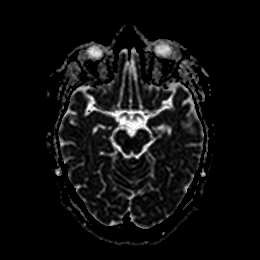
[im 31/46]
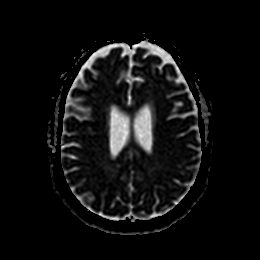
[im 46/46]
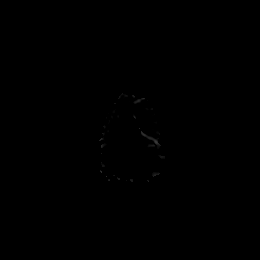

[Series 7: DWI · coronal · 4.0mm · 0.88mm/px · 6 of 68 slices shown (3 of 4)]
[im 1/68]
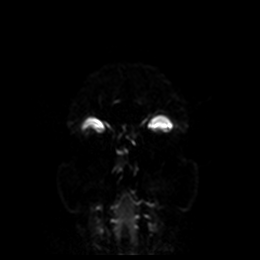
[im 14/68]
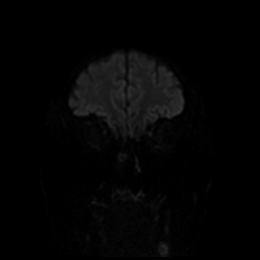
[im 27/68]
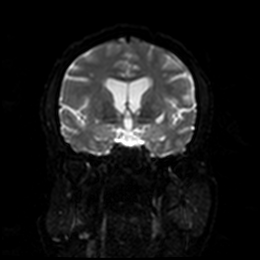
[im 41/68]
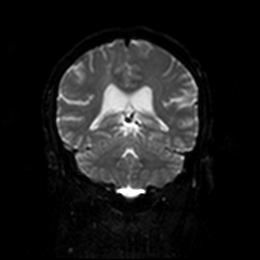
[im 54/68]
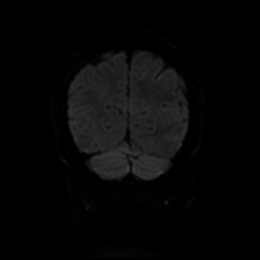
[im 68/68]
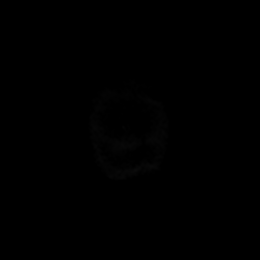

[Series 8: DWI · coronal · 4.0mm · 0.88mm/px · 3 of 34 slices shown (4 of 4)]
[im 1/34]
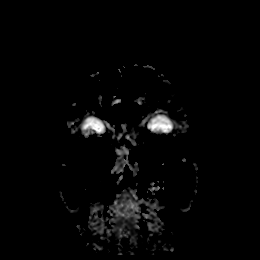
[im 17/34]
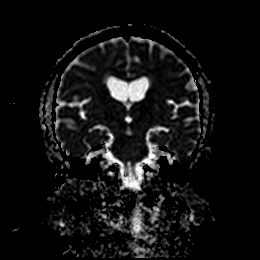
[im 34/34]
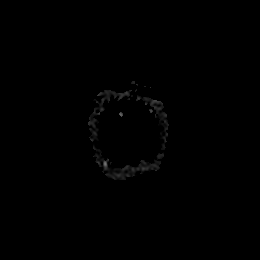

[Series 9: T1 · sagittal · 5.0mm · 0.75mm/px · 2 of 23 slices shown]
[im 1/23]
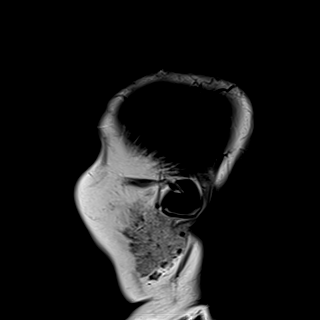
[im 23/23]
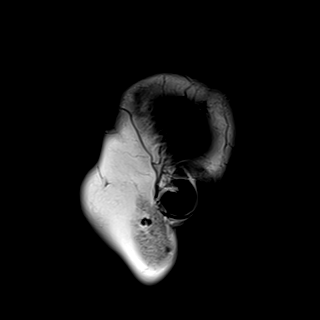

[Series 10: T2 · axial · 5.0mm · 0.72mm/px · z∈[-50,+99]mm · 2 of 26 slices shown (1 of 2)]
[im 1/26]
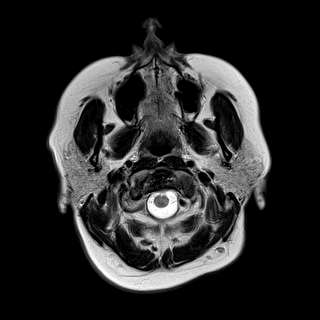
[im 26/26]
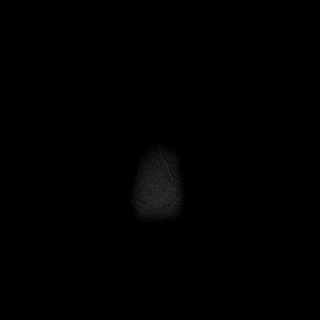

[Series 11: FLAIR · axial · 5.0mm · 0.45mm/px · z∈[-48,+101]mm · 2 of 26 slices shown]
[im 1/26]
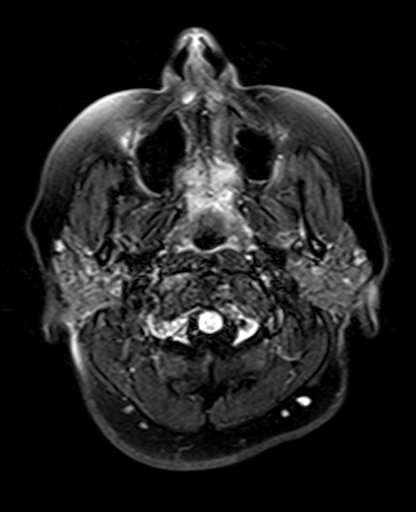
[im 26/26]
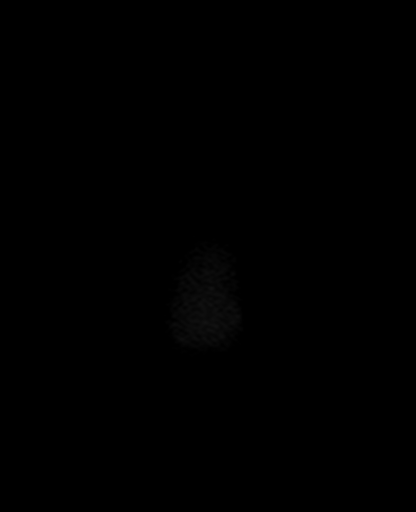

[Series 12: swi_images · axial · 3.0mm · 0.90mm/px · z∈[-62,+115]mm · 6 of 60 slices shown]
[im 1/60]
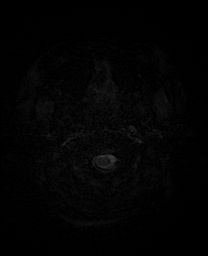
[im 12/60]
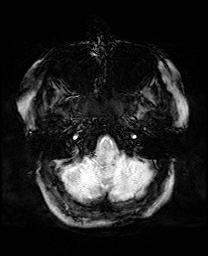
[im 24/60]
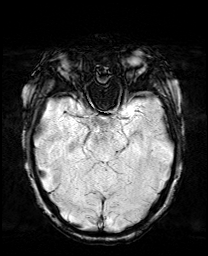
[im 36/60]
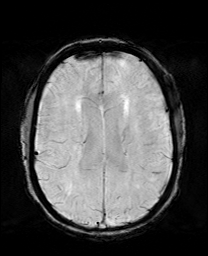
[im 48/60]
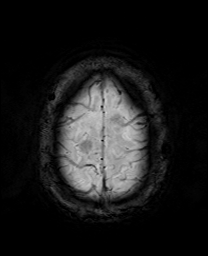
[im 60/60]
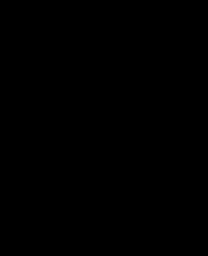

[Series 13: mip_images(sw) · axial · 24.0mm · 0.90mm/px · z∈[-51,+104]mm · 5 of 53 slices shown]
[im 1/53]
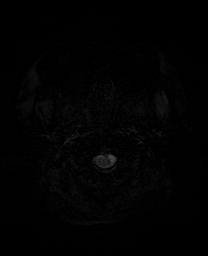
[im 14/53]
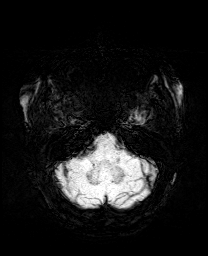
[im 27/53]
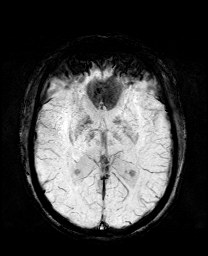
[im 40/53]
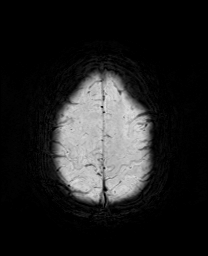
[im 53/53]
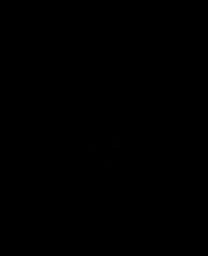

[Series 15: T2 · coronal · 5.0mm · 0.34mm/px · 3 of 30 slices shown (2 of 2)]
[im 1/30]
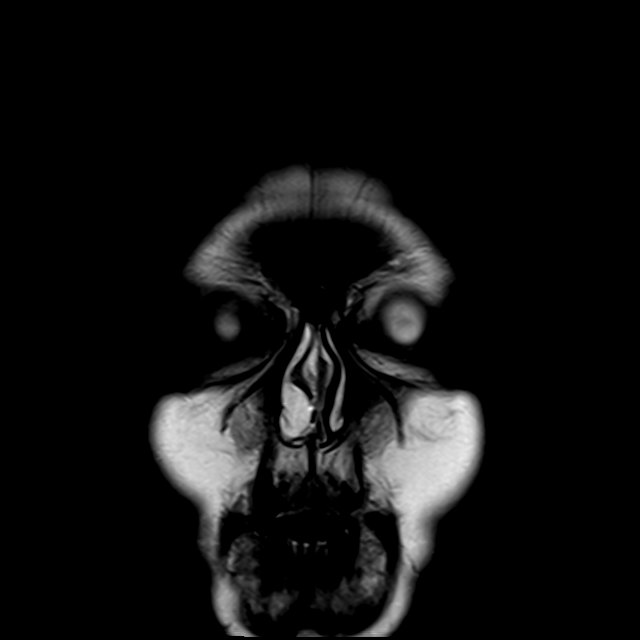
[im 15/30]
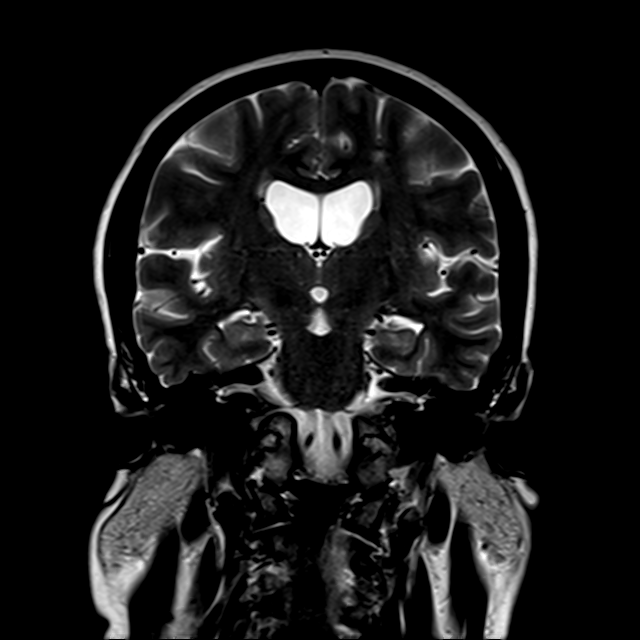
[im 30/30]
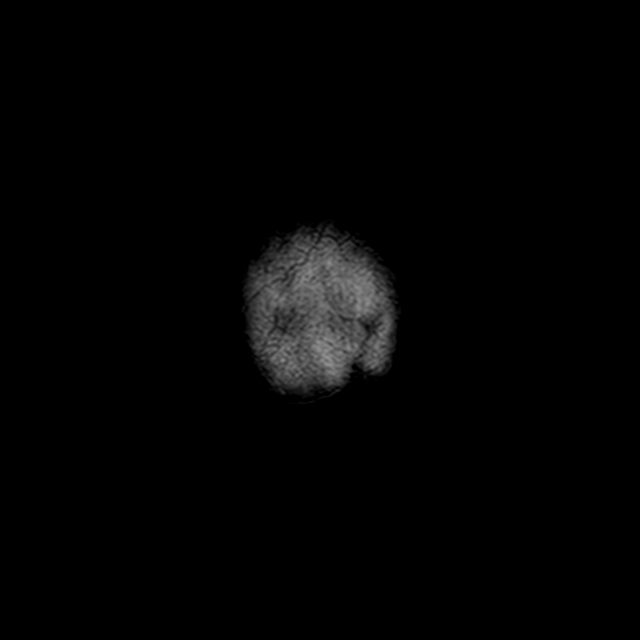

[42 of 48 positions shown; findings below may reference images not displayed]

FINDINGS: Brain: No acute infarction, hemorrhage, hydrocephalus, extra-axial
collection or mass lesion. Small chronic infarct of the right
superior frontoparietal junction with mild wallerian degeneration
extending into the right posterior limb of internal capsule. Several
nonspecific T2 FLAIR hyperintensities in subcortical and
periventricular white matter are advanced for age. Mild volume loss
of the brain. No lesion is identified within the corpus callosum,
basal ganglia, or the posterior fossa.

Vascular: Normal flow voids.

Skull and upper cervical spine: Normal marrow signal.

Sinuses/Orbits: Partial opacification of left mastoid tip. No other
abnormal signal of paranasal sinuses or mastoid air cells.

Other: None.
IMPRESSION: 1. No acute intracranial abnormality identified.
2. Small chronic infarct of the right superior frontoparietal
junction.
3. Nonspecific white matter hyperintensities and volume loss of the
brain unexpected for age. Some differential considerations include
age advanced microvascular ischemic changes, vasculopathy,
demyelination, or sequelae of an infectious/inflammatory process.

## 2020-03-26 ENCOUNTER — Telehealth: Payer: Self-pay

## 2020-03-26 ENCOUNTER — Ambulatory Visit (INDEPENDENT_AMBULATORY_CARE_PROVIDER_SITE_OTHER): Payer: Medicaid Other | Admitting: Adult Health

## 2020-03-26 ENCOUNTER — Other Ambulatory Visit: Payer: Self-pay

## 2020-03-26 ENCOUNTER — Encounter: Payer: Self-pay | Admitting: Adult Health

## 2020-03-26 VITALS — BP 132/76 | HR 74 | Temp 97.5°F | Ht 68.0 in | Wt 244.0 lb

## 2020-03-26 DIAGNOSIS — Z9989 Dependence on other enabling machines and devices: Secondary | ICD-10-CM

## 2020-03-26 DIAGNOSIS — G4733 Obstructive sleep apnea (adult) (pediatric): Secondary | ICD-10-CM | POA: Diagnosis not present

## 2020-03-26 NOTE — Progress Notes (Signed)
Community message sent to [facility]. 

## 2020-03-26 NOTE — Patient Instructions (Signed)

## 2020-03-26 NOTE — Progress Notes (Signed)
PATIENT: Jennifer Ali DOB: Sep 03, 1971  REASON FOR VISIT: follow up HISTORY FROM: patient  HISTORY OF PRESENT ILLNESS: Today 03/26/20: Jennifer Ali is a 49 year old female with a history of obstructive sleep apnea on CPAP.  Download indicates that she used her machine 30 out of 30 days for compliance of 100%.  She used her machine greater than 4 hours 29 days for compliance of 97%.  On average she uses her machine 6 hours and 8 minutes.  Her residual AHI is 1 on 7 to 15 cm of water with EPR 3.  Leak in the 95th percentile is 46.2 L/min.  Reports that she does feel her mask leaking at night.  Reports that she takes her dentures out at night and she feels that this is causing the mask to leak.   HISTORY Chiefconcernaccording to patient : Post stroke sleep study in a patient with excessive daytime sleepiness.  I have the pleasure of seeing Jennifer Ali on 10-31-2019,,a right handed Caucasian female with a sleep disorder. She   has a past medical history of Anemia, Borderline personality disorder (HCC) (07/09/2016), Coronary artery disease, Dyslipidemia (07/22/2015), Heart attack (HCC), High cholesterol, Hypertension, Hypothyroidism, Ischemic stroke (HCC) (01/31/2018), Left leg numbness (11/02/2018), Left leg pain (11/02/2018), Major depressive disorder, recurrent episode, severe (HCC) (01/25/2019), Obesity, Old MI (myocardial infarction) (07/22/2015), Orthostatic hypotension (07/22/2015), Patent foramen ovale, and Posttraumatic stress disorder (07/09/2016).Jennifer Ore Dawn Ratcliffeis a 49 year old female, seen in request by FNP Allyn Kenner, who works with primary care physician Dr. Aida Puffer  I have reviewed and summarized the referring note from the referring physician. She reported a history of hypothyroidism, on supplement, hypertension, coronary artery disease in the past, In February 2019, she woke up noticed left leg weakness, gradually getting worse over  the next few days, to the point of difficulty walking, also clumsiness of her left hand, She was treated at [facility] under the last name Blessyn, [place], was diagnosed with stroke, this was followed by rehabilitation, and the left AFO, she came in today with fourfoot cane, wearing left AFO, she continue complains of gait abnormality, left low back pain, radiating pain to left lower extremity, frequent left leg muscle spasm, has tried over-the-counter Tylenol ibuprofen without helping,  I reviewed the history from [facility], MRI of the brain in February 2019: Acute right frontal parietal infarction, severe chronic microvascular ischemic changes with remote lacunar infarction. Multiple foci of some semi confluent of restricted diffusion in the superior right frontal and parietal lobe with associated leptomeningeal enhancement and increased T2/FLAIR signal. There is severe periventricular deep white matter increased T2-FLAIR signal consistent with chronic microvascular ischemic changes. Several old centrum semiovale lacunar infarct. Vessel wall imaging was performed and no vessel wall enhancement is noted. There are no extra-axial fluid collections present. Intracranial MRA does not demonstrate any aneurysms or high grade stenoses. Ultrasound of carotid artery: Left internal carotid artery less than 40% stenosis, right internal carotid artery was normal, subclavian's was normal, both vertebrals were patent with anterograde flow. MRI of cervical spine: Degenerative disc disease results in severe left neural foraminal narrowing at C4-5 and mild canal stenosis at C5-6. Laboratory evaluations hemoglobin 12.8, normal free T4 was within normal limits 1.25, A1c 5.3, triglyceride 227, LDL 52.  The patient underwent PFO closure 10-20-2019, at [facility].    Sleeprelevant medical history: high fatigue, no insomnia, hypersomnia, anemia, Nocturia 3-4 times , snoring and witnessed apneas.  Recent  stroke, PFO, embolic stroke.  Familymedical /sleep history:No other family member with OSA, insomnia, no known sleep walkers.  Social history:Patient is disabled since her stroke 2019  and lives in a household with 8 persons/ alone.  Family status is single-, she lives with both parents, sister and brother- in -law and their 4 children.  2 dogs are present. She has 3 grown children, has no grandchildren.  Tobacco use:  Quit 2017. ETOH use : none , Caffeine intake in form of Coffee( none ) Soda( none ) Tea ( 2-3 glasses a day) or energy drinks. Regular exercise in form of PT.    Sleep habits are as follows:The patient's dinner time is between 7 PM. The patient goes to bed at 11 PM and continues to sleep for 2 hours, wakes for several bathroom breaks.   The preferred sleep position is laterally or supine , with the support of 2 pillows. Dreams are reportedly frequent/vivid. 10 AM is the usual rise time. The patient wakes up spontaneously at 8-  Lets the  dogs out- but stays in bed again after that. Bedroom is cool, dark, quiet.  She reports not feeling refreshed or restored in AM, with symptoms such as dry mouth , but no morning headaches , and residual fatigue al day.  Naps are taken frequently, lasting from 30 to 45 minutes in PM in front of the TV  REVIEW OF SYSTEMS: Out of a complete 14 system review of symptoms, the patient complains only of the following symptoms, and all other reviewed systems are negative.  ESS 15  ALLERGIES: Allergies  Allergen Reactions  . Sulfa Antibiotics Hives    HOME MEDICATIONS: Outpatient Medications Prior to Visit  Medication Sig Dispense Refill  . amoxicillin (AMOXIL) 500 MG tablet Take 4 tablets (2,000 mg total) by mouth as directed. Take 4 tablets 1 hour prior to dental work, including routine cleanings for 6 months after PFO closure. 12 tablet 0  . chlorthalidone (HYGROTON) 25 MG tablet Take 25 mg by mouth daily.   3  . clopidogrel  (PLAVIX) 75 MG tablet Take 1 tablet (75 mg total) by mouth daily. 90 tablet 3  . escitalopram (LEXAPRO) 20 MG tablet Take 20 mg by mouth daily.    Marland Kitchen gabapentin (NEURONTIN) 300 MG capsule Take 1 capsule (300 mg total) by mouth 3 (three) times daily. 90 capsule 11  . levothyroxine (SYNTHROID, LEVOTHROID) 200 MCG tablet Take 200 mcg by mouth daily before breakfast.   11  . metoprolol succinate (TOPROL-XL) 100 MG 24 hr tablet Take 100 mg by mouth daily.   3  . potassium chloride SA (KLOR-CON M20) 20 MEQ tablet Take 1 tablet (20 mEq total) by mouth daily. 90 tablet 2  . rosuvastatin (CRESTOR) 10 MG tablet Take 1 tablet (10 mg total) by mouth daily. 90 tablet 2  . traZODone (DESYREL) 50 MG tablet Take 50 mg by mouth at bedtime.     No facility-administered medications prior to visit.    PAST MEDICAL HISTORY: Past Medical History:  Diagnosis Date  . Anemia   . Borderline personality disorder (HCC) 07/09/2016  . Coronary artery disease   . Dyslipidemia 07/22/2015   Overview:  Low HDL  . Heart attack (HCC)   . High cholesterol   . Hypertension   . Hypothyroidism   . Ischemic stroke (HCC) 01/31/2018  . Left leg numbness 11/02/2018  . Left leg pain 11/02/2018  . Major depressive disorder, recurrent episode, severe (HCC) 01/25/2019  . Obesity   . Old MI (myocardial  infarction) 07/22/2015   Overview:  1. s/p MI Dec. 2005. S/P PCI and CYpher stent LAD  2. No sig. disease by cath, June,2010  3.Stress echo negative for ischemia at 10 mets in June 2013  . Orthostatic hypotension 07/22/2015  . Patent foramen ovale    (a) by TEE 09/06/19 "small PFO with predominantly R to L shunting across the atrial septum"  . Posttraumatic stress disorder 07/09/2016    PAST SURGICAL HISTORY: Past Surgical History:  Procedure Laterality Date  . ABDOMINAL HYSTERECTOMY    . ANTERIOR CRUCIATE LIGAMENT REPAIR    . BUBBLE STUDY  09/06/2019   Procedure: BUBBLE STUDY;  Surgeon: Skeet Latch, MD;  Location: [facility];   Service: Cardiovascular;;  . CARDIAC CATHETERIZATION    . CORONARY ANGIOPLASTY WITH STENT PLACEMENT    . DILATION AND CURETTAGE OF UTERUS    . PATENT FORAMEN OVALE(PFO) CLOSURE N/A 10/20/2019   Procedure: PATENT FORAMEN OVALE (PFO) CLOSURE;  Surgeon: Sherren Mocha, MD;  Location: [facility];  Service: Cardiovascular;  Laterality: N/A;  . TEE WITHOUT CARDIOVERSION N/A 09/06/2019   Procedure: TRANSESOPHAGEAL ECHOCARDIOGRAM (TEE);  Surgeon: Skeet Latch, MD;  Location: [facility];  Service: Cardiovascular;  Laterality: N/A;  . TONSILLECTOMY      FAMILY HISTORY: Family History  Problem Relation Age of Onset  . Stroke Mother   . Diabetes Mother   . Hypertension Mother   . Hyperlipidemia Mother   . Diabetes Father     SOCIAL HISTORY: Social History   Socioeconomic History  . Marital status: Married    Spouse name: Not on file  . Number of children: Not on file  . Years of education: Not on file  . Highest education level: Not on file  Occupational History  . Not on file  Tobacco Use  . Smoking status: Former Smoker    Packs/day: 0.50    Types: Cigarettes  . Smokeless tobacco: Never Used  Substance and Sexual Activity  . Alcohol use: Not Currently    Comment: occassionally  . Drug use: Not Currently    Types: Marijuana, Methamphetamines, Cocaine  . Sexual activity: Not on file  Other Topics Concern  . Not on file  Social History Narrative   Lives with mother, father and fmaily   Caffeine use: soda/tea daily   Right handed   Social Determinants of Health   Financial Resource Strain:   . Difficulty of Paying Living Expenses:   Food Insecurity:   . Worried About Charity fundraiser in the Last Year:   . Arboriculturist in the Last Year:   Transportation Needs:   . Film/video editor (Medical):   Marland Kitchen Lack of Transportation (Non-Medical):   Physical Activity:   . Days of Exercise per Week:   . Minutes of Exercise per Session:   Stress:   .  Feeling of Stress :   Social Connections:   . Frequency of Communication with Friends and Family:   . Frequency of Social Gatherings with Friends and Family:   . Attends Religious Services:   . Active Member of Clubs or Organizations:   . Attends Archivist Meetings:   Marland Kitchen Marital Status:   Intimate Partner Violence:   . Fear of Current or Ex-Partner:   . Emotionally Abused:   Marland Kitchen Physically Abused:   . Sexually Abused:       PHYSICAL EXAM  Vitals:   03/26/20 1047  BP: 132/76  Pulse: 74  Temp: (!) 97.5  F (36.4 C)  Weight: 244 lb (110.7 kg)  Height: 5\' 8"  (1.727 m)   Body mass index is 37.1 kg/m.  Generalized: Well developed, in no acute distress  Chest: Lungs clear to auscultation bilaterally  Neurological examination  Mentation: Alert oriented to time, place, history taking. Follows all commands speech and language fluent Cranial nerve II-XII: Extraocular movements were full, visual field were full on confrontational test Head turning and shoulder shrug  were normal and symmetric. Motor: The motor testing reveals 5 over 5 strength of all 4 extremities. Good symmetric motor tone is noted throughout.  Sensory: Sensory testing is intact to soft touch on all 4 extremities. No evidence of extinction is noted.  Gait and station: Gait is normal.    DIAGNOSTIC DATA (LABS, IMAGING, TESTING) - I reviewed patient records, labs, notes, testing and imaging myself where available.  Lab Results  Component Value Date   WBC 11.4 (H) 10/07/2019   HGB 13.9 10/07/2019   HCT 40.6 10/07/2019   MCV 94 10/07/2019   PLT 289 10/07/2019      Component Value Date/Time   NA 146 (H) 10/07/2019 1445   K 3.5 10/07/2019 1445   CL 102 10/07/2019 1445   CO2 27 10/07/2019 1445   GLUCOSE 109 (H) 10/07/2019 1445   GLUCOSE 137 (H) 11/28/2018 1835   BUN 17 10/07/2019 1445   CREATININE 1.06 (H) 10/07/2019 1445   CALCIUM 9.1 10/07/2019 1445   GFRNONAA 62 10/07/2019 1445   GFRAA 72  10/07/2019 1445      ASSESSMENT AND PLAN 49 y.o. year old female  has a past medical history of Anemia, Borderline personality disorder (HCC) (07/09/2016), Coronary artery disease, Dyslipidemia (07/22/2015), Heart attack (HCC), High cholesterol, Hypertension, Hypothyroidism, Ischemic stroke (HCC) (01/31/2018), Left leg numbness (11/02/2018), Left leg pain (11/02/2018), Major depressive disorder, recurrent episode, severe (HCC) (01/25/2019), Obesity, Old MI (myocardial infarction) (07/22/2015), Orthostatic hypotension (07/22/2015), Patent foramen ovale, and Posttraumatic stress disorder (07/09/2016). here with:  1. OSA on CPAP  - CPAP compliance excellent - Good treatment of AHI  - Encourage patient to use CPAP nightly and > 4 hours each night -Mask refitting - F/U in 6 months or sooner if needed   I spent 20 minutes of face-to-face and non-face-to-face time with patient.  This included previsit chart review, lab review, study review, order entry, electronic health record documentation, patient education.  07/11/2016, MSN, NP-C 03/26/2020, 10:42 AM [facility] [address] [place], [place] [id number]

## 2020-05-16 NOTE — Telephone Encounter (Signed)
Error

## 2020-05-29 ENCOUNTER — Ambulatory Visit: Payer: Medicaid Other | Admitting: Neurology

## 2020-06-20 ENCOUNTER — Ambulatory Visit: Payer: Medicaid Other | Admitting: Neurology

## 2020-06-20 ENCOUNTER — Telehealth: Payer: Self-pay | Admitting: Neurology

## 2020-06-20 ENCOUNTER — Encounter: Payer: Self-pay | Admitting: Neurology

## 2020-06-20 VITALS — BP 132/91 | HR 64 | Ht 68.0 in | Wt 252.0 lb

## 2020-06-20 DIAGNOSIS — R32 Unspecified urinary incontinence: Secondary | ICD-10-CM

## 2020-06-20 DIAGNOSIS — I69354 Hemiplegia and hemiparesis following cerebral infarction affecting left non-dominant side: Secondary | ICD-10-CM | POA: Diagnosis not present

## 2020-06-20 DIAGNOSIS — G4733 Obstructive sleep apnea (adult) (pediatric): Secondary | ICD-10-CM | POA: Diagnosis not present

## 2020-06-20 DIAGNOSIS — Q211 Atrial septal defect: Secondary | ICD-10-CM

## 2020-06-20 DIAGNOSIS — Z9989 Dependence on other enabling machines and devices: Secondary | ICD-10-CM

## 2020-06-20 DIAGNOSIS — R269 Unspecified abnormalities of gait and mobility: Secondary | ICD-10-CM

## 2020-06-20 DIAGNOSIS — Q2112 Patent foramen ovale: Secondary | ICD-10-CM

## 2020-06-20 MED ORDER — GABAPENTIN 300 MG PO CAPS: 300.0000 mg | ORAL_CAPSULE | Freq: Three times a day (TID) | ORAL | 4 refills | Status: DC

## 2020-06-20 NOTE — Telephone Encounter (Signed)
Medicaid order sent to GI. They will obtain the auth and reach out to the patient to schedule.  
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
## 2020-07-01 ENCOUNTER — Other Ambulatory Visit: Payer: Medicaid Other

## 2020-07-23 ENCOUNTER — Ambulatory Visit: Payer: Medicaid Other | Admitting: Neurology

## 2020-07-23 ENCOUNTER — Other Ambulatory Visit: Payer: Medicaid Other

## 2020-07-23 ENCOUNTER — Encounter (INDEPENDENT_AMBULATORY_CARE_PROVIDER_SITE_OTHER): Payer: Medicaid Other | Admitting: Neurology

## 2020-07-23 ENCOUNTER — Telehealth: Payer: Self-pay | Admitting: Neurology

## 2020-07-23 DIAGNOSIS — I69354 Hemiplegia and hemiparesis following cerebral infarction affecting left non-dominant side: Secondary | ICD-10-CM

## 2020-07-23 DIAGNOSIS — R269 Unspecified abnormalities of gait and mobility: Secondary | ICD-10-CM

## 2020-07-23 DIAGNOSIS — G4733 Obstructive sleep apnea (adult) (pediatric): Secondary | ICD-10-CM

## 2020-07-23 DIAGNOSIS — R531 Weakness: Secondary | ICD-10-CM | POA: Diagnosis not present

## 2020-07-23 DIAGNOSIS — Q211 Atrial septal defect: Secondary | ICD-10-CM

## 2020-07-23 DIAGNOSIS — R32 Unspecified urinary incontinence: Secondary | ICD-10-CM

## 2020-07-23 DIAGNOSIS — Q2112 Patent foramen ovale: Secondary | ICD-10-CM

## 2020-07-23 DIAGNOSIS — Z0289 Encounter for other administrative examinations: Secondary | ICD-10-CM

## 2020-07-23 NOTE — Procedures (Signed)
Full Name: Gwynn Chalker Gender: Female MRN #: [id number] Date of Birth: 1971/10/31    Visit Date: 07/23/2020 08:14 Age: 49 Years Examining Physician: Levert Feinstein, MD  Referring Physician: Levert Feinstein, MD Height: 5 feet 8 inch History: 49 year old female with history of stroke with residual left hemiparesis, presenting with worsening gait abnormality bilateral lower extremity weakness, bowel and bladder incontinence  Summary of the test: Nerve conduction study: Bilateral sural, superficial peroneal sensory responses were normal. Bilateral peroneal to EDB and tibial motor responses were normal.  Electromyography: Selected needle examination of the right upper, lower extremity muscles, right cervical and lumbosacral paraspinal muscles were normal.   Conclusion: This is a normal study. There is no electrodiagnostic evidence of intrinsic muscle disease, right cervical, or lumbosacral radiculopathy.    ------------------------------- Levert Feinstein, M.D. PhD  [facility] [address] [place], [place] [address] Tel: [phone] Fax: [phone]  Verbal informed consent was obtained from the patient, patient was informed of potential risk of procedure, including bruising, bleeding, hematoma formation, infection, muscle weakness, muscle pain, numbness, among others.        MNC    Nerve / Sites Muscle Latency Ref. Amplitude Ref. Rel Amp Segments Distance Velocity Ref. Area    ms ms mV mV %  cm m/s m/s mVms  R Peroneal - EDB     Ankle EDB 4.4 ?6.5 4.4 ?2.0 100 Ankle - EDB 9   14.6     Fib head EDB 10.6  3.7  84.6 Fib head - Ankle 30 49 ?44 17.1     Pop fossa EDB 12.7  7.8  209 Pop fossa - Fib head 10 48 ?44 25.8         Pop fossa - Ankle      L Peroneal - EDB     Ankle EDB 4.3 ?6.5 8.4 ?2.0 100 Ankle - EDB 9   28.9     Fib head EDB 10.4  7.9  93.9 Fib head - Ankle 30 49 ?44 30.5     Pop fossa EDB 12.5  8.0  101 Pop fossa - Fib head 10 47 ?44  29.7         Pop fossa - Ankle      R Tibial - AH     Ankle AH 2.7 ?5.8 8.1 ?4.0 100 Ankle - AH 9   19.7     Pop fossa AH 12.1  6.8  83.7 Pop fossa - Ankle 40 43 ?41 20.1  L Tibial - AH     Ankle AH 3.8 ?5.8 12.8 ?4.0 100 Ankle - AH 9   27.3     Pop fossa AH 13.5  10.7  83.5 Pop fossa - Ankle 40 42 ?41 24.2             SNC    Nerve / Sites Rec. Site Peak Lat Ref.  Amp Ref. Segments Distance    ms ms V V  cm  R Sural - Ankle (Calf)     Calf Ankle 3.0 ?4.4 6 ?6 Calf - Ankle 14  L Sural - Ankle (Calf)     Calf Ankle 3.9 ?4.4 9 ?6 Calf - Ankle 14  R Superficial peroneal - Ankle     Lat leg Ankle 3.6 ?4.4 6 ?6 Lat leg - Ankle 14  L Superficial peroneal - Ankle     Lat leg Ankle 4.2 ?4.4 8 ?6 Lat leg - Ankle 14  F  Wave    Nerve F Lat Ref.   ms ms  R Tibial - AH 50.4 ?56.0  L Tibial - AH 50.8 ?56.0         EMG Summary Table    Spontaneous MUAP Recruitment  Muscle IA Fib PSW Fasc Other Amp Dur. Poly Pattern  R. Tibialis anterior Normal None None None _______ Normal Normal Normal Normal  R. Tibialis posterior Normal None None None _______ Normal Normal Normal Normal  R. Peroneus longus Normal None None None _______ Normal Normal Normal Normal  R. Gastrocnemius (Medial head) Normal None None None _______ Normal Normal Normal Normal  R. Vastus lateralis Normal None None None _______ Normal Normal Normal Normal  R. Lumbar paraspinals (mid) Normal None None None _______ Normal Normal Normal Normal  R. Lumbar paraspinals (low) Normal None None None _______ Normal Normal Normal Normal  R. First dorsal interosseous Normal None None None _______ Normal Normal Normal Normal  R. Pronator teres Normal None None None _______ Normal Normal Normal Normal  R. Biceps brachii Normal None None None _______ Normal Normal Normal Normal  R. Deltoid Normal None None None _______ Normal Normal Normal Normal  R. Triceps brachii Normal None None None _______ Normal Normal Normal Normal  R.  Cervical paraspinals Normal None None None _______ Normal Normal Normal Normal

## 2020-07-23 NOTE — Telephone Encounter (Signed)
I have ordered MRI of thoracic, lumbar and cervical spine  she is on schedule for MRI of thoracic spine for tomorrow July 24, 2020, is she on schedule for MRI of lumbar and cervical spine as well?  Please cancel her follow-up appointment with Aundra Millet, give her a follow-up appointment with me after MRIs

## 2020-07-24 ENCOUNTER — Other Ambulatory Visit: Payer: Medicaid Other

## 2020-07-24 NOTE — Telephone Encounter (Signed)
She is scheduled for all 3 exams on 07/25/20 at GI.

## 2020-07-25 ENCOUNTER — Other Ambulatory Visit: Payer: Self-pay

## 2020-07-25 ENCOUNTER — Ambulatory Visit
Admission: RE | Admit: 2020-07-25 | Discharge: 2020-07-25 | Disposition: A | Discharge status: Home / Self Care | Payer: Medicaid Other | Source: Ambulatory Visit | Attending: Neurology | Admitting: Neurology

## 2020-07-25 DIAGNOSIS — I69354 Hemiplegia and hemiparesis following cerebral infarction affecting left non-dominant side: Secondary | ICD-10-CM

## 2020-07-25 DIAGNOSIS — R269 Unspecified abnormalities of gait and mobility: Secondary | ICD-10-CM

## 2020-07-25 DIAGNOSIS — G4733 Obstructive sleep apnea (adult) (pediatric): Secondary | ICD-10-CM

## 2020-07-25 DIAGNOSIS — R32 Unspecified urinary incontinence: Secondary | ICD-10-CM

## 2020-07-25 DIAGNOSIS — Q2112 Patent foramen ovale: Secondary | ICD-10-CM

## 2020-07-25 DIAGNOSIS — Z9989 Dependence on other enabling machines and devices: Secondary | ICD-10-CM

## 2020-07-26 ENCOUNTER — Telehealth: Payer: Self-pay | Admitting: Neurology

## 2020-07-26 NOTE — Telephone Encounter (Signed)
IMPRESSION:   MRI lumbar spine (without) demonstrating: - At L4-5: facet hypertrophy with moderate biforaminal stenosis. - At L5-S1: disc bulging and facet hypertrophy with moderate biforaminal stenosis. - At L1-2, L3-4: disc bulging and facet hypertrophy with mild biforaminal stenosis.   Please call patient, MRI of thoracic spine was normal, cervical spine showed mild degenerative changes, no significant canal or foraminal narrowing, m  Most noticeable degenerative changes was at lumbar spine, evidence of facet hypertrophy with moderate foraminal narrowing at L4-5, L5-S1,  I will review MRIs with her at next follow-up visit

## 2020-07-26 NOTE — Telephone Encounter (Signed)
I spoke to the patient and she verbalized of her MRI results. She will keep her appt on 08/21/20 for further discussion.

## 2020-07-30 ENCOUNTER — Other Ambulatory Visit: Payer: Self-pay | Admitting: Physician Assistant

## 2020-07-30 DIAGNOSIS — Z8774 Personal history of (corrected) congenital malformations of heart and circulatory system: Secondary | ICD-10-CM

## 2020-08-21 ENCOUNTER — Encounter: Payer: Self-pay | Admitting: Neurology

## 2020-08-21 ENCOUNTER — Ambulatory Visit: Payer: Medicaid Other | Admitting: Neurology

## 2020-08-21 VITALS — BP 105/63 | HR 56 | Ht 68.0 in | Wt 233.5 lb

## 2020-08-21 DIAGNOSIS — R32 Unspecified urinary incontinence: Secondary | ICD-10-CM

## 2020-08-21 DIAGNOSIS — I69354 Hemiplegia and hemiparesis following cerebral infarction affecting left non-dominant side: Secondary | ICD-10-CM | POA: Diagnosis not present

## 2020-08-21 DIAGNOSIS — I639 Cerebral infarction, unspecified: Secondary | ICD-10-CM | POA: Diagnosis not present
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
## 2020-08-29 ENCOUNTER — Other Ambulatory Visit (HOSPITAL_COMMUNITY): Payer: Medicaid Other

## 2020-09-27 ENCOUNTER — Ambulatory Visit: Payer: Self-pay | Admitting: Neurology

## 2020-10-02 ENCOUNTER — Ambulatory Visit: Payer: Medicaid Other | Admitting: Adult Health

## 2020-10-04 ENCOUNTER — Ambulatory Visit: Payer: Medicaid Other | Admitting: Physician Assistant

## 2020-10-04 ENCOUNTER — Encounter: Payer: Self-pay | Admitting: Physician Assistant

## 2020-10-04 ENCOUNTER — Other Ambulatory Visit: Payer: Self-pay

## 2020-10-04 ENCOUNTER — Ambulatory Visit (HOSPITAL_COMMUNITY): Payer: Medicaid Other | Attending: Cardiology

## 2020-10-04 VITALS — BP 110/62 | HR 55 | Ht 68.0 in | Wt 225.6 lb

## 2020-10-04 DIAGNOSIS — Z8774 Personal history of (corrected) congenital malformations of heart and circulatory system: Secondary | ICD-10-CM

## 2020-10-04 DIAGNOSIS — R0902 Hypoxemia: Secondary | ICD-10-CM | POA: Diagnosis not present

## 2020-10-04 DIAGNOSIS — E119 Type 2 diabetes mellitus without complications: Secondary | ICD-10-CM | POA: Diagnosis not present

## 2020-10-04 DIAGNOSIS — I2510 Atherosclerotic heart disease of native coronary artery without angina pectoris: Secondary | ICD-10-CM | POA: Diagnosis not present

## 2020-10-04 DIAGNOSIS — Z9861 Coronary angioplasty status: Secondary | ICD-10-CM

## 2020-10-04 LAB — ECHOCARDIOGRAM COMPLETE BUBBLE STUDY
Area-P 1/2: 3.05 cm2
S' Lateral: 2.6 cm

## 2020-10-04 MED ORDER — ASPIRIN EC 81 MG PO TBEC
81.0000 mg | DELAYED_RELEASE_TABLET | Freq: Every day | ORAL | 3 refills | Status: DC
Start: 2020-10-04 — End: 2020-12-26

## 2020-10-04 MED ORDER — SODIUM CHLORIDE 0.9% FLUSH
10.0000 mL | INTRAVENOUS | Status: AC | PRN
  Administered 2020-10-04: 10 mL via INTRAVENOUS

## 2020-10-04 MED ORDER — PERFLUTREN LIPID MICROSPHERE
1.0000 mL | INTRAVENOUS | Status: AC | PRN
  Administered 2020-10-04: 3 mL via INTRAVENOUS

## 2020-10-04 NOTE — Patient Instructions (Addendum)
Medication Instructions:  1) STOP PLAVIX  2) START ASPIRIN 81 mg daily  Lab Work: TODAY! CBC  Follow-Up: You have been referred to PULMONARY for hypoxia.   Your provider recommends that you schedule a follow-up appointment in 3 months with Dr. Dulce Sellar.

## 2020-10-04 NOTE — Progress Notes (Signed)
[facility]   MULTIDISCIPLINARY HEART VALVE CLINIC                                       Cardiology Office Note    Date:  10/05/2020   ID:  Jennifer Ali, DOB 1971-03-30, MRN [id number]  PCP:  Aida Puffer, MD  Cardiologist: Dr. Dulce Sellar  CC: 1 year s/p PFO closure  History of Present Illness:  Jennifer Ali is a 49 y.o. female with a history of OSA on CPAP, morbid obesity, hypothyroidism, carotid artery diease, recently diagnosed DMT2, CVA with residual gait instability, CAD s/p remote PCI and PFO s/p PFO closure (10/20/19) who presents to clinic for follow up.  She presented in 01/2018 with weakness and numbness down her left arm and leg. She was treated in [facility] and was told that she had a stroke but she was well outside of the treatment window.Carotid duplex with 40% stenosis of L ICA. She underwent bubble study 08/26/19 with LVEF 60-65%, impaired LV relaxation, no significant valvular dysfunction, RV normal size and function, but notedscant immediate contrast passage across atrial septum.TEE 09/06/19 showed a small PFO with predominantly right to left shunting across the atrial septum and that the interatrial septum is aneurysmal.  She underwent successful transcatheter PFO closure with a 25 mm Amplatzer PFO occluderon 10/20/19. Post op echo showed EF 55-60%, normally functioning PFO occluder with no atrial level shunt by color flow doppler.  Today she presents to clinic for follow up. Doing okay. Recently diagnosed with DMT2 and started with Metfotmin. She has had ongoing shortness of breath for the past few months. No chest pain. No LE edema, orthopnea or PND. No dizziness or syncope. No blood in stool or urine. No palpitations. SHe does sleep with a CPAP. She is fully vaccinated with Moderna.    Past Medical History:  Diagnosis Date  . Anemia   . Borderline personality disorder (HCC) 07/09/2016  . Coronary artery disease   .  Dyslipidemia 07/22/2015   Overview:  Low HDL  . Heart attack (HCC)   . High cholesterol   . Hypertension   . Hypothyroidism   . Ischemic stroke (HCC) 01/31/2018  . Left leg numbness 11/02/2018  . Left leg pain 11/02/2018  . Major depressive disorder, recurrent episode, severe (HCC) 01/25/2019  . Obesity   . 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 s/p MI Dec. 2005. S/P PCI and CYpher stent LAD  2. No sig. disease by cath, June,2010  3.Stress echo negative for ischemia at 10 mets in June 2013  . Orthostatic hypotension 07/22/2015  . Patent foramen ovale    (a) by TEE 09/06/19 "small PFO with predominantly R to L shunting across the atrial septum"  . Posttraumatic stress disorder 07/09/2016    Past Surgical History:  Procedure Laterality Date  . ABDOMINAL HYSTERECTOMY    . ANTERIOR CRUCIATE LIGAMENT REPAIR    . BUBBLE STUDY  09/06/2019   Procedure: BUBBLE STUDY;  Surgeon: Chilton Si, MD;  Location: [facility] ENDOSCOPY;  Service: Cardiovascular;;  . CARDIAC CATHETERIZATION    . CORONARY ANGIOPLASTY WITH STENT PLACEMENT    . DILATION AND CURETTAGE OF UTERUS    . PATENT FORAMEN OVALE(PFO) CLOSURE N/A 10/20/2019   Procedure: PATENT FORAMEN OVALE (PFO) CLOSURE;  Surgeon: Tonny Bollman, MD;  Location: [facility] INVASIVE CV LAB;  Service: Cardiovascular;  Laterality: N/A;  .  TEE WITHOUT CARDIOVERSION N/A 09/06/2019   Procedure: TRANSESOPHAGEAL ECHOCARDIOGRAM (TEE);  Surgeon: Chilton Si, MD;  Location: [facility] ENDOSCOPY;  Service: Cardiovascular;  Laterality: N/A;  . TONSILLECTOMY      Current Medications: Outpatient Medications Prior to Visit  Medication Sig Dispense Refill  . busPIRone (BUSPAR) 5 MG tablet Take 5 mg by mouth 2 (two) times daily as needed for anxiety.    . cyclobenzaprine (FLEXERIL) 5 MG tablet Take 5 mg by mouth every 6 (six) hours as needed. As needed    . escitalopram (LEXAPRO) 20 MG tablet Take 20 mg by mouth daily.    Marland Kitchen gabapentin (NEURONTIN) 300 MG capsule  Take 1 capsule (300 mg total) by mouth 3 (three) times daily. 270 capsule 4  . HYDROcodone-acetaminophen (NORCO/VICODIN) 5-325 MG tablet Take 1 tablet by mouth every 6 (six) hours as needed for moderate pain.    Marland Kitchen levothyroxine (SYNTHROID) 175 MCG tablet Take 175 mcg by mouth daily before breakfast.    . metFORMIN (GLUCOPHAGE) 1000 MG tablet Take 1,000 mg by mouth 2 (two) times daily.    . metoprolol succinate (TOPROL-XL) 100 MG 24 hr tablet Take 100 mg by mouth daily.   3  . potassium chloride SA (KLOR-CON M20) 20 MEQ tablet Take 1 tablet (20 mEq total) by mouth daily. 90 tablet 2  . rosuvastatin (CRESTOR) 10 MG tablet Take 1 tablet (10 mg total) by mouth daily. 90 tablet 2  . traZODone (DESYREL) 50 MG tablet Take 50 mg by mouth at bedtime.    . clopidogrel (PLAVIX) 75 MG tablet Take 75 mg by mouth daily.    . ABILIFY 5 MG tablet Take 5 mg by mouth at bedtime. (Patient not taking: Reported on 10/04/2020)    . levothyroxine (SYNTHROID, LEVOTHROID) 200 MCG tablet Take 200 mcg by mouth daily before breakfast.  (Patient not taking: Reported on 10/04/2020)  11   No facility-administered medications prior to visit.     Allergies:   Sulfa antibiotics   Social History   Socioeconomic History  . Marital status: Married    Spouse name: Not on file  . Number of children: Not on file  . Years of education: Not on file  . Highest education level: Not on file  Occupational History  . Not on file  Tobacco Use  . Smoking status: Former Smoker    Packs/day: 0.50    Types: Cigarettes  . Smokeless tobacco: Never Used  Vaping Use  . Vaping Use: Never used  Substance and Sexual Activity  . Alcohol use: Not Currently    Comment: occassionally  . Drug use: Not Currently    Types: Marijuana, Methamphetamines, Cocaine  . Sexual activity: Not on file  Other Topics Concern  . Not on file  Social History Narrative   Lives with mother, father and fmaily   Caffeine use: soda/tea daily   Right handed     Social Determinants of Health   Financial Resource Strain:   . Difficulty of Paying Living Expenses: Not on file  Food Insecurity:   . Worried About Programme researcher, broadcasting/film/video in the Last Year: Not on file  . Ran Out of Food in the Last Year: Not on file  Transportation Needs:   . Lack of Transportation (Medical): Not on file  . Lack of Transportation (Non-Medical): Not on file  Physical Activity:   . Days of Exercise per Week: Not on file  . Minutes of Exercise per Session: Not on file  Stress:   .  Feeling of Stress : Not on file  Social Connections:   . Frequency of Communication with Friends and Family: Not on file  . Frequency of Social Gatherings with Friends and Family: Not on file  . Attends Religious Services: Not on file  . Active Member of Clubs or Organizations: Not on file  . Attends Banker Meetings: Not on file  . Marital Status: Not on file     Family History:  The patient's family history includes Diabetes in her father and mother; Hyperlipidemia in her mother; Hypertension in her mother; Stroke in her mother.     ROS:   Please see the history of present illness.    ROS All other systems reviewed and are negative.   PHYSICAL EXAM:   VS:  BP 110/62   Pulse (!) 55   Ht 5\' 8"  (1.727 m)   Wt 225 lb 9.6 oz (102.3 kg)   SpO2 93%   BMI 34.30 kg/m    GEN: Well nourished, well developed, in no acute distress, obese HEENT: normal Neck: no JVD or masses Cardiac: RRR; no murmurs, rubs, or gallops,no edema  Respiratory:  clear to auscultation bilaterally, normal work of breathing GI: soft, nontender, nondistended, + BS MS: no deformity or atrophy Skin: warm and dry, no rash Neuro:  Alert and Oriented x 3, Strength and sensation are intact Psych: euthymic mood, full affect   Wt Readings from Last 3 Encounters:  10/04/20 225 lb 9.6 oz (102.3 kg)  08/21/20 233 lb 8 oz (105.9 kg)  06/20/20 252 lb (114.3 kg)      Studies/Labs Reviewed:   EKG:  EKG  is NOT ordered today.    Recent Labs: 10/07/2019: BUN 17; Creatinine, Ser 1.06; Potassium 3.5; Sodium 146 10/04/2020: Hemoglobin 14.1; Platelets 292   Lipid Panel No results found for: CHOL, TRIG, HDL, CHOLHDL, VLDL, LDLCALC, LDLDIRECT  Additional studies/ records that were reviewed today include:  10/20/19 PATENT FORAMEN OVALE (PFO) CLOSURE  Conclusion  Successful transcatheter PFO closure with a 25 mm Amplatzer PFO occluder using intracardiac echo and fluoroscopicguidance    ______________  Echo 10/20/19 IMPRESSIONS 1. There is an interatrial septal occluder device present. No evidence of residual shunting. 2. Left ventricular ejection fraction, by visual estimation, is 55 to 60%. The left ventricle has normal function. There is no left ventricular hypertrophy. 3. Left atrial size was normal. 4. Right atrial size was normal. 5. The tricuspid valve is grossly normal. Tricuspid valve regurgitation is not demonstrated. 6. The aortic valve is grossly normal. Aortic valve regurgitation is not visualized. 7. Global right ventricle has normal systolic function.The right ventricular size is normal. No increase in right ventricular wall thickness. 8. The mitral valve is grossly normal. No evidence of mitral valve regurgitation. 9. The aortic root was not well visualized. 10. The pulmonic valve was not assessed. Pulmonic valve regurgitation not assessed.  ___________________   Echo w/ bubble 10/04/20 1. S/P PFO closure; no residual shunt noted; negative saline  microcavitation study.  2. Left ventricular ejection fraction, by estimation, is 60 to 65%. The  left ventricle has normal function. The left ventricle has no regional  wall motion abnormalities. Left ventricular diastolic parameters are  indeterminate.  3. Right ventricular systolic function is normal. The right ventricular  size is normal.  4. The mitral valve is normal in structure. No evidence of mitral  valve  regurgitation. No evidence of mitral stenosis.  5. The aortic valve is tricuspid. Aortic valve regurgitation is not  visualized. No aortic stenosis is present.  6. The inferior vena cava is normal in size with greater than 50%  respiratory variability, suggesting right atrial pressure of 3 mmHg.  7. Agitated saline contrast bubble study was negative, with no evidence  of any interatrial shunt.   ASSESSMENT & PLAN:   PFO s/p PFO closure: echo today show normal LV function and negative bubble study. Currently on plavix. Will stop this and start a baby aspirin. No longer needs SBE prophylaxis. Will get her back in with her primary cardiologist, Dr. Dulce Sellar. Follow up with structural PRN.   New onset hypoxia: pt has had worsening dyspnea over the past several months. 02 sats noted to be down to 91-93% with rest and 89% with ambulation. She is not tachycardic or tachypnic and review of echo today shows normal RV. She wears a CPAP at night. question OHS. Will check blood counts to rule out anemia and refer to pulm   DMT2: continue Metformin.   CAD s/p remote PCI: continue aspirin and statin   Hx of CVA: with residual deficit. Walks with a cane.   Medication Adjustments/Labs and Tests Ordered: Current medicines are reviewed at length with the patient today.  Concerns regarding medicines are outlined above.  Medication changes, Labs and Tests ordered today are listed in the Patient Instructions below. Patient Instructions  Medication Instructions:  1) STOP PLAVIX  2) START ASPIRIN 81 mg daily  Lab Work: TODAY! CBC  Follow-Up: You have been referred to PULMONARY for hypoxia.   Your provider recommends that you schedule a follow-up appointment in 3 months with Dr. Dulce Sellar.    Signed, Cline Crock, PA-C  10/05/2020 12:38 PM    [facility] [address] [place], [place], [place]  [address] Phone: [phone]; Fax: [phone]

## 2020-10-05 LAB — CBC WITH DIFFERENTIAL/PLATELET
Basophils Absolute: 0 10*3/uL (ref 0.0–0.2)
Basos: 0 %
EOS (ABSOLUTE): 0.1 10*3/uL (ref 0.0–0.4)
Eos: 1 %
Hematocrit: 43.3 % (ref 34.0–46.6)
Hemoglobin: 14.1 g/dL (ref 11.1–15.9)
Immature Grans (Abs): 0 10*3/uL (ref 0.0–0.1)
Immature Granulocytes: 0 %
Lymphocytes Absolute: 1.8 10*3/uL (ref 0.7–3.1)
Lymphs: 17 %
MCH: 28.6 pg (ref 26.6–33.0)
MCHC: 32.6 g/dL (ref 31.5–35.7)
MCV: 88 fL (ref 79–97)
Monocytes Absolute: 0.5 10*3/uL (ref 0.1–0.9)
Monocytes: 5 %
Neutrophils Absolute: 7.7 10*3/uL — ABNORMAL HIGH (ref 1.4–7.0)
Neutrophils: 77 %
Platelets: 292 10*3/uL (ref 150–450)
RBC: 4.93 x10E6/uL (ref 3.77–5.28)
RDW: 11.7 % (ref 11.7–15.4)
WBC: 10.2 10*3/uL (ref 3.4–10.8)

## 2020-10-23 ENCOUNTER — Other Ambulatory Visit: Payer: Self-pay | Admitting: Internal Medicine

## 2020-10-23 ENCOUNTER — Encounter: Payer: Self-pay | Admitting: Internal Medicine

## 2020-10-23 ENCOUNTER — Other Ambulatory Visit: Payer: Self-pay

## 2020-10-23 ENCOUNTER — Ambulatory Visit: Payer: Medicaid Other | Admitting: Internal Medicine

## 2020-10-23 VITALS — BP 110/70 | HR 75 | Temp 97.3°F | Ht 68.0 in | Wt 223.2 lb

## 2020-10-23 DIAGNOSIS — R0609 Other forms of dyspnea: Secondary | ICD-10-CM

## 2020-10-23 DIAGNOSIS — R0600 Dyspnea, unspecified: Secondary | ICD-10-CM | POA: Diagnosis not present

## 2020-10-23 DIAGNOSIS — R0602 Shortness of breath: Secondary | ICD-10-CM

## 2020-10-23 LAB — POCT EXHALED NITRIC OXIDE: FeNO level (ppb): 14

## 2020-10-23 NOTE — Progress Notes (Signed)
Thank you :)

## 2020-10-23 NOTE — Progress Notes (Signed)
OV 10/23/2020  Subjective:  Patient ID: Durward Parcel, female , DOB: June 30, 1971 , age 49 y.o. , MRN: [id number] , ADDRESS: [address] [place] [address] PCP Aida Puffer, MD Patient Care Team: Aida Puffer, MD as PCP - General (Family Medicine) Baldo Daub, MD as PCP - Cardiology (Cardiology) Hughie Closs, Huston Foley, MD (Physical Medicine and Rehabilitation)  This Provider for this visit: Treatment Team:  Attending Provider: Kalman Shan, MD    10/23/2020 -   Chief Complaint  Patient presents with  . Consult    Hypoxia, HX of OSA     HPI [name] 49 y.o. -referred by cardiology.  She tells me that she had a stroke and during the work-up of this a few years ago she was found to have patent foramen ovale.  She was status post PFO closure last year.  Since then overall she is doing well.  She recently saw the cardiologist and she tells me that at the time when they did a walking desaturation test she desaturated and therefore she has been referred here.  This was just 2 weeks ago.  She says since then she has become more self aware that she is short of breath.  In review of the cardiology note it appears that she is got insidious onset of shortness of breath for the last few months present on exertion relieved by rest but she is denying this at this point in time.  She does have sleep apnea.  She recollects a sleep test within the last year or 2.  She says is no nocturnal desaturations.  She is not on oxygen with the CPAP at home.  Dr. Vickey Huger is a sleep specialist.  There is no cough or wheezing  Today when we walked her she initially desaturated but as we continue to walk her she improved with her pulse ox.  Resting pulse ox was 95%.  Resting heart rate was 80/min.  She then dropped to 88% but quickly rebounded to 92%.  This happened 2 times.  She walked very slow and towards and was short of breath.  Final pulse ox was 91% with a  heart rate of 92/min.   Her recent labs are documented below.  Her exhaled nitric oxide test today  feno 14  Results for [name], [name] (MRN [id number]) as of 10/23/2020 14:47  Ref. Range 10/04/2020 14:54  Hemoglobin Latest Ref Range: 11.1 - 15.9 g/dL 44.8   Results for [name], [name] (MRN [id number]) as of 10/23/2020 14:47  Ref. Range 10/07/2019 14:45  Creatinine Latest Ref Range: 0.57 - 1.00 mg/dL 0.26 (H)   ROS - per HPI  Chest x-ray November 28, 2018: Personally visualized and agree with Dr. Ephriam Knuckles and the radiologist it looks clear.  Echocardiogram October 04, 2020 bubble study  IMPRESSIONS    1. S/P PFO closure; no residual shunt noted; negative saline  microcavitation study.  2. Left ventricular ejection fraction, by estimation, is 60 to 65%. The  left ventricle has normal function. The left ventricle has no regional  wall motion abnormalities. Left ventricular diastolic parameters are  indeterminate.  3. Right ventricular systolic function is normal. The right ventricular  size is normal.  4. The mitral valve is normal in structure. No evidence of mitral valve  regurgitation. No evidence of mitral stenosis.  5. The aortic valve is tricuspid. Aortic valve regurgitation is not  visualized. No aortic stenosis is present.  6. The inferior vena  cava is normal in size with greater than 50%  respiratory variability, suggesting right atrial pressure of 3 mmHg.  7. Agitated saline contrast bubble study was negative, with no evidence  of any interatrial shunt   RECENT HX 10/04/20 with cardiology Myria Rocky Vogelsong is a 49 y.o. female with a history of OSA on CPAP, morbid obesity, hypothyroidism, carotid artery diease, recently diagnosed DMT2, CVA with residual gait instability, CAD s/p remote PCI and PFO s/p PFO closure (10/20/19) who presents to clinic for follow up.  She presented in 01/2018 with weakness and numbness down her left arm  and leg. She was treated in [facility] and was told that she had a stroke but she was well outside of the treatment window.Carotid duplex with 40% stenosis of L ICA. She underwent bubble study 08/26/19 with LVEF 60-65%, impaired LV relaxation, no significant valvular dysfunction, RV normal size and function, but notedscant immediate contrast passage across atrial septum.TEE 09/06/19 showed a small PFO with predominantly right to left shunting across the atrial septum and that the interatrial septum is aneurysmal.  She underwent successful transcatheter PFO closure with a 25 mm Amplatzer PFO occluderon 10/20/19. Post op echo showed EF 55-60%, normally functioning PFO occluder with no atrial level shunt by color flow doppler.  Today she presents to clinic for follow up. Doing okay. Recently diagnosed with DMT2 and started with Metfotmin. She has had ongoing shortness of breath for the past few months. No chest pain. No LE edema, orthopnea or PND. No dizziness or syncope. No blood in stool or urine. No palpitations. SHe does sleep with a CPAP. She is fully vaccinated with Moderna New onset hypoxia: pt has had worsening dyspnea over the past several months. 02 sats noted to be down to 91-93% with rest and 89% with ambulation. She is not tachycardic or tachypnic and review of echo today shows normal RV. She wears a CPAP at night. question OHS. Will check blood counts to rule out anemia and refer to pulm     has a past medical history of Anemia, Borderline personality disorder (HCC) (07/09/2016), Coronary artery disease, Dyslipidemia (07/22/2015), Heart attack (HCC), High cholesterol, Hypertension, Hypothyroidism, Ischemic stroke (HCC) (01/31/2018), Left leg numbness (11/02/2018), Left leg pain (11/02/2018), Major depressive disorder, recurrent episode, severe (HCC) (01/25/2019), Obesity, Old MI (myocardial infarction) (07/22/2015), Orthostatic hypotension (07/22/2015), Patent foramen ovale, and Posttraumatic stress  disorder (07/09/2016).   reports that she has quit smoking. Her smoking use included cigarettes. She smoked 0.50 packs per day. She has never used smokeless tobacco.  Past Surgical History:  Procedure Laterality Date  . ABDOMINAL HYSTERECTOMY    . ANTERIOR CRUCIATE LIGAMENT REPAIR    . BUBBLE STUDY  09/06/2019   Procedure: BUBBLE STUDY;  Surgeon: Chilton Si, MD;  Location: [facility] ENDOSCOPY;  Service: Cardiovascular;;  . CARDIAC CATHETERIZATION    . CORONARY ANGIOPLASTY WITH STENT PLACEMENT    . DILATION AND CURETTAGE OF UTERUS    . PATENT FORAMEN OVALE(PFO) CLOSURE N/A 10/20/2019   Procedure: PATENT FORAMEN OVALE (PFO) CLOSURE;  Surgeon: Tonny Bollman, MD;  Location[facility] INVASIVE CV LAB;  Service: Cardiovascular;  Laterality: N/A;  . TEE WITHOUT CARDIOVERSION N/A 09/06/2019   Procedure: TRANSESOPHAGEAL ECHOCARDIOGRAM (TEE);  Surgeon: Chilton Si, MD;  Location: [facility] ENDOSCOPY;  Service: Cardiovascular;  Laterality: N/A;  . TONSILLECTOMY      Allergies  Allergen Reactions  . Sulfa Antibiotics Hives     There is no immunization history on file for this patient.  Family History  Problem Relation Age of Onset  . Stroke Mother   . Diabetes Mother   . Hypertension Mother   . Hyperlipidemia Mother   . Diabetes Father      Current Outpatient Medications:  .  aspirin EC 81 MG tablet, Take 1 tablet (81 mg total) by mouth daily. Swallow whole., Disp: 90 tablet, Rfl: 3 .  busPIRone (BUSPAR) 5 MG tablet, Take 5 mg by mouth 2 (two) times daily as needed for anxiety., Disp: , Rfl:  .  cyclobenzaprine (FLEXERIL) 5 MG tablet, Take 5 mg by mouth every 6 (six) hours as needed. As needed, Disp: , Rfl:  .  escitalopram (LEXAPRO) 20 MG tablet, Take 20 mg by mouth daily., Disp: , Rfl:  .  gabapentin (NEURONTIN) 300 MG capsule, Take 1 capsule (300 mg total) by mouth 3 (three) times daily., Disp: 270 capsule, Rfl: 4 .  HYDROcodone-acetaminophen (NORCO/VICODIN) 5-325 MG tablet, Take 1 tablet  by mouth every 6 (six) hours as needed for moderate pain., Disp: , Rfl:  .  lamoTRIgine (LAMICTAL) 25 MG tablet, Take 25 mg by mouth daily., Disp: , Rfl:  .  levothyroxine (SYNTHROID) 175 MCG tablet, Take 175 mcg by mouth daily before breakfast., Disp: , Rfl:  .  metFORMIN (GLUCOPHAGE) 1000 MG tablet, Take 1,000 mg by mouth 2 (two) times daily., Disp: , Rfl:  .  metoprolol succinate (TOPROL-XL) 100 MG 24 hr tablet, Take 100 mg by mouth daily. , Disp: , Rfl: 3 .  potassium chloride SA (KLOR-CON M20) 20 MEQ tablet, Take 1 tablet (20 mEq total) by mouth daily., Disp: 90 tablet, Rfl: 2 .  rosuvastatin (CRESTOR) 10 MG tablet, Take 1 tablet (10 mg total) by mouth daily., Disp: 90 tablet, Rfl: 2 .  traZODone (DESYREL) 50 MG tablet, Take 50 mg by mouth at bedtime., Disp: , Rfl:       Objective:   Vitals:   10/23/20 1434  BP: 110/70  Pulse: 75  Temp: (!) 97.3 F (36.3 C)  TempSrc: Oral  SpO2: 95%  Weight: 223 lb 3.2 oz (101.2 kg)  Height: 5\' 8"  (1.727 m)    Estimated body mass index is 33.94 kg/m as calculated from the following:   Height as of this encounter: 5\' 8"  (1.727 m).   Weight as of this encounter: 223 lb 3.2 oz (101.2 kg).  @WEIGHTCHANGE @    10/23/20 1434  Weight: 223 lb 3.2 oz (101.2 kg)     Physical Exam  General Appearance:    Alert, cooperative, no distress, appears stated age - yes , Deconditioned looking - no , OBESE  - yes, Sitting on Wheelchair -  n  Head:    Normocephalic, without obvious abnormality, atraumatic  Eyes:    PERRL, conjunctiva/corneas clear,  Ears:    Normal TM's and external ear canals, both ears  Nose:   Nares normal, septum midline, mucosa normal, no drainage    or sinus tenderness. OXYGEN ON  - no . Patient is @ ra   Throat:   Lips, mucosa, and tongue normal; teeth and gums normal. Cyanosis on lips - no  Neck:   Supple, symmetrical, trachea midline, no adenopathy;    thyroid:  no enlargement/tenderness/nodules; no carotid    bruit or JVD  Back:     Symmetric, no curvature, ROM normal, no CVA tenderness  Lungs:     Distress - no , Wheeze no, Barrell Chest - no, Purse lip breathing - no, Crackles - no   Chest Wall:  No tenderness or deformity.    Heart:    Regular rate and rhythm, S1 and S2 normal, no rub   or gallop, Murmur - no  Breast Exam:    NOT DONE  Abdomen:     Soft, non-tender, bowel sounds active all four quadrants,    no masses, no organomegaly. Visceral obesity - yes  Genitalia:   NOT DONE  Rectal:   NOT DONE  Extremities:   Extremities - normal, Has Cane - n, Clubbing - no, Edema - no  Pulses:   2+ and symmetric all extremities  Skin:   Stigmata of Connective Tissue Disease - no  Lymph nodes:   Cervical, supraclavicular, and axillary nodes normal  Psychiatric:  Neurologic:   Pleasant - yes, Anxious - no, Flat affect - no  CAm-ICU - neg, Alert and Oriented x 3 - yes, Moves all 4s - yes, Speech - normal, Cognition - intact         Assessment:       ICD-10-CM   1. Dyspnea on exertion  R06.00 POCT EXHALED NITRIC OXIDE   Need to rule out interstitial lung disease but this could all be obesity related atelectasis.  Other possibilities include asthma (doubt with feno being normal) or chronic PEs.   Plan:     Patient Instructions     ICD-10-CM   1. Dyspnea on exertion  R06.00     Cause remains not known  Plan  Respect flu shot deferral  Do HRCT supine and prone, inspiratory and expiratory volume  Do ONO on room air  Do full PFT  Do CXR with VQ scan  - rule out chronic PE   Followup  -return to see APP in few to several weeks but after completing above       SIGNATURE    Dr. Kalman Shan, M.D., F.C.C.P,  Pulmonary and Critical Care Medicine Staff Physician, [facility] Director - Interstitial Lung Disease  Program  Pulmonary Fibrosis [facility] at [facility] [place], [place], [address]  Pager: [phone], If no answer  or between  15:00h - 7:00h: call [phone] Telephone: [phone]  3:14 PM 10/23/2020

## 2020-10-23 NOTE — Patient Instructions (Signed)
ICD-10-CM   1. Dyspnea on exertion  R06.00     Cause remains not known  Plan  Respect flu shot deferral  Do HRCT supine and prone, inspiratory and expiratory volume  Do ONO on room air  Do full PFT  Do CXR with VQ scan  - rule out chronic PE   Followup  -return to see APP in few to several weeks but after completing above

## 2020-10-24 ENCOUNTER — Ambulatory Visit: Payer: Medicaid Other

## 2020-10-24 NOTE — Addendum Note (Signed)
Addended by: Delrae Rend on: 10/24/2020 01:59 PM   Modules accepted: Orders

## 2020-11-12 ENCOUNTER — Other Ambulatory Visit: Payer: Self-pay | Admitting: Internal Medicine

## 2020-11-12 DIAGNOSIS — R0602 Shortness of breath: Secondary | ICD-10-CM

## 2020-11-13 ENCOUNTER — Other Ambulatory Visit: Payer: Self-pay

## 2020-11-13 ENCOUNTER — Encounter (HOSPITAL_COMMUNITY): Admission: RE | Admit: 2020-11-13 | Payer: Medicaid Other | Source: Ambulatory Visit

## 2020-11-13 ENCOUNTER — Ambulatory Visit (HOSPITAL_COMMUNITY)
Admission: RE | Admit: 2020-11-13 | Discharge: 2020-11-13 | Disposition: A | Discharge status: Home / Self Care | Payer: Medicaid Other | Source: Ambulatory Visit | Attending: Internal Medicine | Admitting: Internal Medicine

## 2020-11-13 ENCOUNTER — Encounter (HOSPITAL_COMMUNITY): Payer: Self-pay

## 2020-11-13 DIAGNOSIS — R0602 Shortness of breath: Secondary | ICD-10-CM

## 2020-11-13 MED ORDER — TECHNETIUM TO 99M ALBUMIN AGGREGATED
4.3800 | Freq: Once | INTRAVENOUS | Status: AC
  Administered 2020-11-13: 4.38 via INTRAVENOUS

## 2020-11-14 NOTE — Progress Notes (Signed)
VQ scan ruled out PE

## 2020-11-14 NOTE — Progress Notes (Signed)
No ild. Results will be discussed during her office visit with APP later in dec 2021  NM Pulmonary Perfusion  Result Date: 11/13/2020 CLINICAL DATA:  Shortness of breath.  Former smoker. EXAM: NUCLEAR MEDICINE PERFUSION LUNG SCAN TECHNIQUE: Perfusion images were obtained in multiple projections after intravenous injection of radiopharmaceutical. Ventilation scans intentionally deferred if perfusion scan and chest x-ray adequate for interpretation during COVID 19 epidemic. RADIOPHARMACEUTICALS:  4.38 mCi Tc-55m MAA IV COMPARISON:  None FINDINGS: There is a uniform distribution of the radiopharmaceutical in both lungs. No peripheral wedge-shaped perfusion defects identified to suggest acute pulmonary embolus. IMPRESSION: 1. Negative exam.  No findings to suggest acute pulmonary embolus. Electronically Signed   By: Signa Kell M.D.   On: 11/13/2020 14:34   CT Chest High Resolution  Result Date: 11/14/2020 CLINICAL DATA:  Shortness of breath EXAM: CT CHEST WITHOUT CONTRAST TECHNIQUE: Multidetector CT imaging of the chest was performed following the standard protocol without intravenous contrast. High resolution imaging of the lungs, as well as inspiratory and expiratory imaging, was performed. COMPARISON:  None. FINDINGS: Cardiovascular: Left coronary artery calcifications and stents. Amplatzer type atrial septal occlusion device. Normal heart size. No pericardial effusion. Mediastinum/Nodes: No enlarged mediastinal, hilar, or axillary lymph nodes. Thyroid gland, trachea, and esophagus demonstrate no significant findings. Lungs/Pleura: No evidence of fibrotic interstitial lung disease. No significant air trapping on expiratory phase imaging. No pleural effusion or pneumothorax. Upper Abdomen: No acute abnormality. Musculoskeletal: No chest wall mass or suspicious bone lesions identified. IMPRESSION: 1. No evidence of fibrotic interstitial lung disease. No significant air trapping on expiratory phase  imaging. 2. Coronary artery disease. Electronically Signed   By: Lauralyn Primes M.D.   On: 11/14/2020 11:13

## 2020-11-20 ENCOUNTER — Telehealth: Payer: Self-pay | Admitting: Internal Medicine

## 2020-11-20 NOTE — Telephone Encounter (Signed)
Overnight oxygen study done on November 12, 2020 shows pulse ox less than or equal to 88% at 5 minutes and 40 seconds  Plan -She technically qualifies for overnight oxygen but this is so borderline that I do not want to start it until she sees Rikki Spearing later in December 2021  -Have left the overnight oxygen results with Amy Hopkins on pod B so it can be sent for scanning

## 2020-11-21 NOTE — Telephone Encounter (Signed)
Attempted to call pt but unable to reach. Left message for her to return call. 

## 2020-11-21 NOTE — Telephone Encounter (Signed)
Pt is returning call regarding results - [phone]

## 2020-11-22 NOTE — Telephone Encounter (Signed)
Called and spoke with pt letting her know the results of ONO and recommendations per MR and she verbalized understanding.  Nothing further needed.

## 2020-11-30 DIAGNOSIS — D649 Anemia, unspecified: Secondary | ICD-10-CM | POA: Insufficient documentation

## 2020-11-30 DIAGNOSIS — E039 Hypothyroidism, unspecified: Secondary | ICD-10-CM | POA: Insufficient documentation

## 2020-11-30 DIAGNOSIS — E7800 Pure hypercholesterolemia, unspecified: Secondary | ICD-10-CM | POA: Insufficient documentation

## 2020-11-30 DIAGNOSIS — E669 Obesity, unspecified: Secondary | ICD-10-CM | POA: Insufficient documentation

## 2020-11-30 DIAGNOSIS — I219 Acute myocardial infarction, unspecified: Secondary | ICD-10-CM | POA: Insufficient documentation

## 2020-12-07 ENCOUNTER — Encounter: Payer: Self-pay | Admitting: Primary Care

## 2020-12-07 ENCOUNTER — Ambulatory Visit: Payer: Medicaid Other | Admitting: Adult Health

## 2020-12-07 ENCOUNTER — Ambulatory Visit (INDEPENDENT_AMBULATORY_CARE_PROVIDER_SITE_OTHER): Payer: Medicaid Other | Admitting: Primary Care

## 2020-12-07 ENCOUNTER — Other Ambulatory Visit: Payer: Self-pay

## 2020-12-07 ENCOUNTER — Ambulatory Visit (INDEPENDENT_AMBULATORY_CARE_PROVIDER_SITE_OTHER): Payer: Medicaid Other | Admitting: Internal Medicine

## 2020-12-07 VITALS — BP 112/70 | HR 47 | Temp 97.7°F | Ht 68.5 in | Wt 215.2 lb

## 2020-12-07 DIAGNOSIS — J984 Other disorders of lung: Secondary | ICD-10-CM | POA: Diagnosis not present

## 2020-12-07 DIAGNOSIS — R0602 Shortness of breath: Secondary | ICD-10-CM

## 2020-12-07 LAB — PULMONARY FUNCTION TEST
DL/VA % pred: 105 %
DL/VA: 4.39 ml/min/mmHg/L
DLCO cor % pred: 81 %
DLCO cor: 19.9 ml/min/mmHg
DLCO unc % pred: 81 %
DLCO unc: 19.9 ml/min/mmHg
FEF 25-75 Post: 2.6 L/sec
FEF 25-75 Pre: 1.62 L/sec
FEF2575-%Change-Post: 60 %
FEF2575-%Pred-Post: 85 %
FEF2575-%Pred-Pre: 53 %
FEV1-%Change-Post: 13 %
FEV1-%Pred-Post: 78 %
FEV1-%Pred-Pre: 68 %
FEV1-Post: 2.55 L
FEV1-Pre: 2.24 L
FEV1FVC-%Change-Post: 5 %
FEV1FVC-%Pred-Pre: 90 %
FEV6-%Change-Post: 5 %
FEV6-%Pred-Post: 81 %
FEV6-%Pred-Pre: 77 %
FEV6-Post: 3.27 L
FEV6-Pre: 3.09 L
FEV6FVC-%Change-Post: 0 %
FEV6FVC-%Pred-Post: 102 %
FEV6FVC-%Pred-Pre: 102 %
FVC-%Change-Post: 7 %
FVC-%Pred-Post: 81 %
FVC-%Pred-Pre: 75 %
FVC-Post: 3.33 L
FVC-Pre: 3.09 L
Post FEV1/FVC ratio: 77 %
Post FEV6/FVC ratio: 99 %
Pre FEV1/FVC ratio: 72 %
Pre FEV6/FVC Ratio: 100 %
RV % pred: 81 %
RV: 1.65 L
TLC % pred: 79 %
TLC: 4.56 L

## 2020-12-07 MED ORDER — BREO ELLIPTA 100-25 MCG/INH IN AEPB
1.0000 | INHALATION_SPRAY | Freq: Every day | RESPIRATORY_TRACT | 0 refills | Status: DC
Start: 2020-12-07 — End: 2020-12-24

## 2020-12-07 NOTE — Progress Notes (Signed)
@Patient  ID: Jennifer Ali, female    DOB: 1971/10/04, 50 y.o.   MRN: [id number]  Chief Complaint  Patient presents with  . Follow-up    SOB with activity      Referring provider: Aida Puffer, MD  HPI: 49 year old female, former smoker.  Last medical history significant for OSA on CPAP, hypertension, old MI, coronary artery disease, ischemic embolic stroke, PFO, hypothyroidism, anemia, depression, posttraumatic stress disorder, obesity.  Patient of Dr. Marchelle Gearing, seen for initial consult on 10/23/2020.  Previous [facility] pulmonary encounter: 10/23/20- Consult, Dr. Jonathon Bellows [facility] 49 y.o. -referred by cardiology.  She tells me that she had a stroke and during the work-up of this a few years ago she was found to have patent foramen ovale.  She was status post PFO closure last year.  Since then overall she is doing well.  She recently saw the cardiologist and she tells me that at the time when they did a walking desaturation test she desaturated and therefore she has been referred here.  This was just 2 weeks ago.  She says since then she has become more self aware that she is short of breath.  In review of the cardiology note it appears that she is got insidious onset of shortness of breath for the last few months present on exertion relieved by rest but she is denying this at this point in time.  She does have sleep apnea.  She recollects a sleep test within the last year or 2.  She says is no nocturnal desaturations.  She is not on oxygen with the CPAP at home.  Dr. Vickey Huger is a sleep specialist.  There is no cough or wheezing  Today when we walked her she initially desaturated but as we continue to walk her she improved with her pulse ox.  Resting pulse ox was 95%.  Resting heart rate was 80/min.  She then dropped to 88% but quickly rebounded to 92%.  This happened 2 times.  She walked very slow and towards and was short of breath.  Final pulse ox was 91% with a  heart rate of 92/min.  Her recent labs are documented below.  Her exhaled nitric oxide test today  feno 14  12/07/2020 - Interim hx  Patient presents today for 6-week follow-up with PFTs. Originally referred by cardiology due to oxygen desaturation on ambulatory walk test.  She does have sleep apnea.  Not on oxygen with CPAP at home. Overnight oximetry study on 11/12/2020 showed pulse ox less than or equal to 88% at 5 minutes and 40 seconds.  Study results are borderline for oxygen need. Dr. Vickey Huger is sleep specialist. High-resolution CAT scan in November 2021 showed no evidence of fibrotic interstitial lung disease.  No significant air trapping, pleural effusion or pneumothorax. She had a normal perfusion exam, no findings to suggest acute pulmonary embolism.  She states that she has always had some degree of shortness of breath at baseline but she has become more aware of it over the last several weeks-month. She experiences dyspnea with ambulation. Reports occasional wheezing after exertion and a morning cough. These symptoms improve with rest. She used to have a nocturnal cough which she attributes with rhinitis symptoms.    Pulmonary function testing: 12/07/2020 - FVC 3.33 (81%), FEV1 2.55 (78%), ratio 77, DLCOunc 19.90 (81%) Moderate restriction with positive bronchodilator response. Normal diffusion capacity   Allergies  Allergen Reactions  . Sulfa Antibiotics Hives     There is  no immunization history on file for this patient.  Past Medical History:  Diagnosis Date  . Anemia   . Borderline personality disorder (HCC) 07/09/2016  . Coronary artery disease   . Dyslipidemia 07/22/2015   Overview:  Low HDL  . Heart attack (HCC)   . High cholesterol   . Hypertension   . Hypothyroidism   . Ischemic stroke (HCC) 01/31/2018  . Left leg numbness 11/02/2018  . Left leg pain 11/02/2018  . Major depressive disorder, recurrent episode, severe (HCC) 01/25/2019  . Obesity   . Old MI  (myocardial infarction) 07/22/2015   Overview:  1. s/p MI Dec. 2005. S/P PCI and CYpher stent LAD  2. No sig. disease by cath, June,2010  3.Stress echo negative for ischemia at 10 mets in June 2013  . Orthostatic hypotension 07/22/2015  . Patent foramen ovale    (a) by TEE 09/06/19 "small PFO with predominantly R to L shunting across the atrial septum"  . Posttraumatic stress disorder 07/09/2016    Tobacco History: Social History   Tobacco Use  Smoking Status Former Smoker  . Packs/day: 0.50  . Years: 25.00  . Pack years: 12.50  . Types: Cigarettes  Smokeless Tobacco Never Used   Counseling given: Not Answered   Outpatient Medications Prior to Visit  Medication Sig Dispense Refill  . aspirin EC 81 MG tablet Take 1 tablet (81 mg total) by mouth daily. Swallow whole. 90 tablet 3  . busPIRone (BUSPAR) 5 MG tablet Take 5 mg by mouth 2 (two) times daily as needed for anxiety.    . cyclobenzaprine (FLEXERIL) 5 MG tablet Take 5 mg by mouth every 6 (six) hours as needed. As needed    . escitalopram (LEXAPRO) 20 MG tablet Take 20 mg by mouth daily.    Marland Kitchen gabapentin (NEURONTIN) 300 MG capsule Take 1 capsule (300 mg total) by mouth 3 (three) times daily. 270 capsule 4  . HYDROcodone-acetaminophen (NORCO/VICODIN) 5-325 MG tablet Take 1 tablet by mouth every 6 (six) hours as needed for moderate pain.    Marland Kitchen lamoTRIgine (LAMICTAL) 25 MG tablet Take 25 mg by mouth daily.    Marland Kitchen levothyroxine (SYNTHROID) 175 MCG tablet Take 175 mcg by mouth daily before breakfast.    . metFORMIN (GLUCOPHAGE) 1000 MG tablet Take 1,000 mg by mouth 2 (two) times daily.    . metoprolol succinate (TOPROL-XL) 100 MG 24 hr tablet Take 100 mg by mouth daily.   3  . potassium chloride SA (KLOR-CON M20) 20 MEQ tablet Take 1 tablet (20 mEq total) by mouth daily. 90 tablet 2  . rosuvastatin (CRESTOR) 10 MG tablet Take 1 tablet (10 mg total) by mouth daily. 90 tablet 2  . traZODone (DESYREL) 50 MG tablet Take 50 mg by mouth at  bedtime.     No facility-administered medications prior to visit.   Review of Systems  Review of Systems  Constitutional: Negative.   HENT: Negative.   Respiratory: Positive for cough and shortness of breath. Negative for chest tightness.   Cardiovascular: Negative.    Physical Exam  BP 112/70 (BP Location: Left Arm, Cuff Size: Normal)   Pulse (!) 47   Temp 97.7 F (36.5 C)   Ht 5' 8.5" (1.74 m)   Wt 215 lb 3.2 oz (97.6 kg)   SpO2 98%   BMI 32.25 kg/m  Physical Exam Constitutional:      Appearance: Normal appearance.  HENT:     Head: Normocephalic and atraumatic.     Mouth/Throat:  Comments: Deferred d/t masking Cardiovascular:     Rate and Rhythm: Regular rhythm. Bradycardia present.     Comments: Regular rhythm, HR 50 Pulmonary:     Effort: Pulmonary effort is normal.     Breath sounds: Normal breath sounds.     Comments: CTA Skin:    General: Skin is warm and dry.  Neurological:     General: No focal deficit present.     Mental Status: She is alert and oriented to person, place, and time. Mental status is at baseline.  Psychiatric:        Mood and Affect: Mood normal.        Behavior: Behavior normal.        Thought Content: Thought content normal.        Judgment: Judgment normal.      Lab Results:  CBC    Component Value Date/Time   WBC 10.2 10/04/2020 1454   WBC 9.5 11/28/2018 1835   RBC 4.93 10/04/2020 1454   RBC 4.50 11/28/2018 1835   HGB 14.1 10/04/2020 1454   HCT 43.3 10/04/2020 1454   PLT 292 10/04/2020 1454   MCV 88 10/04/2020 1454   MCH 28.6 10/04/2020 1454   MCH 32.4 11/28/2018 1835   MCHC 32.6 10/04/2020 1454   MCHC 32.9 11/28/2018 1835   RDW 11.7 10/04/2020 1454   LYMPHSABS 1.8 10/04/2020 1454   MONOABS 0.4 11/28/2018 1835   EOSABS 0.1 10/04/2020 1454   BASOSABS 0.0 10/04/2020 1454    BMET    Component Value Date/Time   NA 146 (H) 10/07/2019 1445   K 3.5 10/07/2019 1445   CL 102 10/07/2019 1445   CO2 27 10/07/2019 1445    GLUCOSE 109 (H) 10/07/2019 1445   GLUCOSE 137 (H) 11/28/2018 1835   BUN 17 10/07/2019 1445   CREATININE 1.06 (H) 10/07/2019 1445   CALCIUM 9.1 10/07/2019 1445   GFRNONAA 62 10/07/2019 1445   GFRAA 72 10/07/2019 1445    BNP No results found for: BNP  ProBNP No results found for: PROBNP  Imaging: NM Pulmonary Perfusion  Result Date: 11/13/2020 CLINICAL DATA:  Shortness of breath.  Former smoker. EXAM: NUCLEAR MEDICINE PERFUSION LUNG SCAN TECHNIQUE: Perfusion images were obtained in multiple projections after intravenous injection of radiopharmaceutical. Ventilation scans intentionally deferred if perfusion scan and chest x-ray adequate for interpretation during COVID 19 epidemic. RADIOPHARMACEUTICALS:  4.38 mCi Tc-63m MAA IV COMPARISON:  None FINDINGS: There is a uniform distribution of the radiopharmaceutical in both lungs. No peripheral wedge-shaped perfusion defects identified to suggest acute pulmonary embolus. IMPRESSION: 1. Negative exam.  No findings to suggest acute pulmonary embolus. Electronically Signed   By: Signa Kell M.D.   On: 11/13/2020 14:34   CT Chest High Resolution  Result Date: 11/14/2020 CLINICAL DATA:  Shortness of breath EXAM: CT CHEST WITHOUT CONTRAST TECHNIQUE: Multidetector CT imaging of the chest was performed following the standard protocol without intravenous contrast. High resolution imaging of the lungs, as well as inspiratory and expiratory imaging, was performed. COMPARISON:  None. FINDINGS: Cardiovascular: Left coronary artery calcifications and stents. Amplatzer type atrial septal occlusion device. Normal heart size. No pericardial effusion. Mediastinum/Nodes: No enlarged mediastinal, hilar, or axillary lymph nodes. Thyroid gland, trachea, and esophagus demonstrate no significant findings. Lungs/Pleura: No evidence of fibrotic interstitial lung disease. No significant air trapping on expiratory phase imaging. No pleural effusion or pneumothorax. Upper  Abdomen: No acute abnormality. Musculoskeletal: No chest wall mass or suspicious bone lesions identified. IMPRESSION: 1. No evidence of  fibrotic interstitial lung disease. No significant air trapping on expiratory phase imaging. 2. Coronary artery disease. Electronically Signed   By: Lauralyn Primes M.D.   On: 11/14/2020 11:13     Assessment & Plan:   Restrictive lung disease - Pulmonary function testing today showed moderate restriction with positive bronchodilator response. No obstruction. Normal diffusion capacity.  - Clinical symptoms and testing consistent with mild intermittent asthma and neuromuscular weakness d/t past stroke   - HRCT showed no evidence of ILD or other acute cardiopulmonary process. VQ scan negative for PE.  - Trial BREO 100 one puff daily - Recommend patient start using Incentive spirometer 3-4 times a day to encourage deep breathing  - FU in 2-4 weeks with repeat walk test   Glenford Bayley, NP 12/07/2020

## 2020-12-07 NOTE — Progress Notes (Signed)
PFT done today. 

## 2020-12-07 NOTE — Patient Instructions (Addendum)
  Test results:  - CT chest- showed no evidence of fibrotic interstitial lung disease.  No significant air trapping, pleural effusion or pneumothorax. Left coronary artery calcifications and stents.  Normal heart size.  - Normal perfusion exam, no findings to suggest acute pulmonary embolism.  - Overnight oximetry showed that you spent 5 mins 40 second with SpO2 level equal to or less than <88%. This is borderline for oxygen, I do not think that you need oxygen at this time. Continue to wear your CPAP every night. You can discuss further with Dr. Vickey Huger  - Pulmonary function testing showed moderate restriction with positive bronchodilator response. This is likely indicative of mild asthma and also from prior stroke. Recommend trying daily inhaler called BREO and use incentive spirometer daily as an exercise to help you take deep breaths  Recommendations: - Start BREO Ellipta - take one puff daily in the morning (rinse mouth after use) - Start using Incentive spirometer device 3-4 times a day (take 5-10 deep breaths)  Follow-up: - 2-4 weeks with Beth or MR

## 2020-12-07 NOTE — Assessment & Plan Note (Addendum)
-   Pulmonary function testing today showed moderate restriction with positive bronchodilator response. No obstruction. Normal diffusion capacity.  - Clinical symptoms and testing consistent with mild intermittent asthma and neuromuscular weakness d/t past stroke   - HRCT showed no evidence of ILD or other acute cardiopulmonary process. VQ scan negative for PE.  - Trial BREO 100 one puff daily - Recommend patient start using Incentive spirometer 3-4 times a day to encourage deep breathing  - FU in 2-4 weeks with repeat walk test

## 2020-12-24 ENCOUNTER — Encounter: Payer: Self-pay | Admitting: Primary Care

## 2020-12-24 ENCOUNTER — Other Ambulatory Visit: Payer: Self-pay

## 2020-12-24 ENCOUNTER — Ambulatory Visit (INDEPENDENT_AMBULATORY_CARE_PROVIDER_SITE_OTHER): Payer: Medicaid Other | Admitting: Primary Care

## 2020-12-24 DIAGNOSIS — J984 Other disorders of lung: Secondary | ICD-10-CM

## 2020-12-24 MED ORDER — FLUTICASONE FUROATE-VILANTEROL 100-25 MCG/INH IN AEPB: 1.0000 | INHALATION_SPRAY | Freq: Every day | RESPIRATORY_TRACT | 5 refills | Status: DC

## 2020-12-24 NOTE — Patient Instructions (Signed)
Ambulatory walk today showed your lowest O2 level was 97% on room air which is normal  Recommend continuing BREO 100 one puff daily in the morning for underlying restrictive lung disease consistent with asthma. Follow-up in 3 months, may be able to trial off inhaler at that time if doing well   If inhaler not covered call us and we will send in another inhaler similar   Follow-up 3 months with Dr. Vernie Shanks his first available after this

## 2020-12-24 NOTE — Progress Notes (Signed)
@Patient  ID: , female    DOB: 1971/02/04, 50 y.o.   MRN: [id number]  Chief Complaint  Patient presents with  . Follow-up    Pt states she has been doing okay since last visit. Has been using the Breo Ellipta inhaler that was prescribed but states she has not noticed much change with symptoms since beginning it.    Referring provider: [id number], MD  HPI: 50 year old female, former smoker.  Last medical history significant for OSA on CPAP, hypertension, old MI, coronary artery disease, ischemic embolic stroke, PFO, hypothyroidism, anemia, depression, posttraumatic stress disorder, obesity.  Patient of Dr. 44, seen for initial consult on 10/23/2020.  Previous [facility] pulmonary encounter: 10/23/20- Consult, Dr. 13/2/21 [facility] 50 y.o. -referred by cardiology.  She tells me that she had a stroke and during the work-up of this a few years ago she was found to have patent foramen ovale.  She was status post PFO closure last year.  Since then overall she is doing well.  She recently saw the cardiologist and she tells me that at the time when they did a walking desaturation test she desaturated and therefore she has been referred here.  This was just 2 weeks ago.  She says since then she has become more self aware that she is short of breath.  In review of the cardiology note it appears that she is got insidious onset of shortness of breath for the last few months present on exertion relieved by rest but she is denying this at this point in time.  She does have sleep apnea.  She recollects a sleep test within the last year or 2.  She says is no nocturnal desaturations.  She is not on oxygen with the CPAP at home.  Dr. 54 is a sleep specialist.  There is no cough or wheezing  Today when we walked her she initially desaturated but as we continue to walk her she improved with her pulse ox.  Resting pulse ox was 95%.  Resting heart rate was 80/min.  She  then dropped to 88% but quickly rebounded to 92%.  This happened 2 times.  She walked very slow and towards and was short of breath.  Final pulse ox was 91% with a heart rate of 92/min.  Her recent labs are documented below.  Her exhaled nitric oxide test today  FENO 14  12/07/2020  Patient presents today for 6-week follow-up with PFTs. Originally referred by cardiology due to oxygen desaturation on ambulatory walk test.  She does have sleep apnea.  Not on oxygen with CPAP at home. Overnight oximetry study on 11/12/2020 showed pulse ox less than or equal to 88% at 5 minutes and 40 seconds.  Study results are borderline for oxygen need. Dr. 11/14/2020 is sleep specialist. High-resolution CAT scan in November 2021 showed no evidence of fibrotic interstitial lung disease.  No significant air trapping, pleural effusion or pneumothorax. She had a normal perfusion exam, no findings to suggest acute pulmonary embolism.  She states that she has always had some degree of shortness of breath at baseline but she has become more aware of it over the last several weeks-month. She experiences dyspnea with ambulation. Reports occasional wheezing after exertion and a morning cough. These symptoms improve with rest. She used to have a nocturnal cough which she attributes with rhinitis symptoms.    12/24/2020 - Interim hx Patient presents today for 3-4 week fu.  Pulmonary function testing showed moderate restriction  with positive bronchodilator response.  Patient was given sample of Breo Ellipta 100 to use once daily in the morning along with incentive spirometer device to facilitate deep breathing exercises. She hasn't noticed much of a difference but she felt well prior to using inhaler. She has occasional dry cough which is not bothersome to her. She gets out of breath after over exerting herself but nothing drastic per patient. She has not had any wheezing. She has some new heart burn symptoms. CAT score 6, Dyspnea sclae  1.   Pulmonary function testing: 12/07/2020 - FVC 3.33 (81%), FEV1 2.55 (78%), ratio 77, DLCOunc 19.90 (81%) Moderate restriction with positive bronchodilator response. Normal diffusion capacity   ONO spent 5 mins 40 second with SpO2 level equal to or less than <88%. This is borderline for oxygen.   10/23/20- Resting pulse ox was 95%. Resting heart rate was 80/min.  She then dropped to 88% but quickly rebounded to 92%.  This happened 2 times.  She walked very slow and towards and was short of breath.  Final pulse ox was 91% with a heart rate of 92/min  12/24/2020 - Lowest O2 97% RA after walking 3 laps slow pace with limp d/t prior stroke  Imaging:  CT chest-  no evidence of fibrotic interstitial lung disease.  No significant air trapping, pleural effusion or pneumothorax. Left coronary artery calcifications and stents.  Normal heart size.  Normal perfusion exam, no findings to suggest acute pulmonary embolism.   Allergies  Allergen Reactions  . Sulfa Antibiotics Hives    Immunization History  Administered Date(s) Administered  . Moderna Sars-Covid-2 Vaccination 08/21/2020, 09/18/2020    Past Medical History:  Diagnosis Date  . Anemia   . Borderline personality disorder ([place]) 07/09/2016  . Coronary artery disease   . Dyslipidemia 07/22/2015   Overview:  Low HDL  . Heart attack ([place])   . High cholesterol   . Hypertension   . Hypothyroidism   . Ischemic stroke ([place]) 01/31/2018  . Left leg numbness 11/02/2018  . Left leg pain 11/02/2018  . Major depressive disorder, recurrent episode, severe ([place]) 01/25/2019  . Obesity   . 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 s/p MI Dec. 2005. S/P PCI and CYpher stent LAD  2. No sig. disease by cath, June,2010  3.Stress echo negative for ischemia at 10 mets in June 2013  . Orthostatic hypotension 07/22/2015  . Patent foramen ovale    (a) by TEE 09/06/19 "small PFO with predominantly R to L shunting across the atrial septum"  .  Posttraumatic stress disorder 07/09/2016    Tobacco History: Social History   Tobacco Use  Smoking Status Former Smoker  . Packs/day: 0.50  . Years: 25.00  . Pack years: 12.50  . Types: Cigarettes  Smokeless Tobacco Never Used   Counseling given: Not Answered   Outpatient Medications Prior to Visit  Medication Sig Dispense Refill  . aspirin EC 81 MG tablet Take 1 tablet (81 mg total) by mouth daily. Swallow whole. 90 tablet 3  . busPIRone (BUSPAR) 5 MG tablet Take 5 mg by mouth 2 (two) times daily as needed for anxiety.    . cyclobenzaprine (FLEXERIL) 5 MG tablet Take 5 mg by mouth every 6 (six) hours as needed. As needed    . FLUoxetine (PROZAC) 40 MG capsule Take 40 mg by mouth at bedtime.    . gabapentin (NEURONTIN) 300 MG capsule Take 1 capsule (300 mg total) by mouth 3 (three) times  daily. 270 capsule 4  . HYDROcodone-acetaminophen (NORCO/VICODIN) 5-325 MG tablet Take 1 tablet by mouth every 6 (six) hours as needed for moderate pain.    Marland Kitchen levothyroxine (SYNTHROID) 175 MCG tablet Take 175 mcg by mouth daily before breakfast.    . metFORMIN (GLUCOPHAGE) 1000 MG tablet Take 1,000 mg by mouth 2 (two) times daily.    . metoprolol succinate (TOPROL-XL) 100 MG 24 hr tablet Take 100 mg by mouth daily.   3  . potassium chloride SA (KLOR-CON M20) 20 MEQ tablet Take 1 tablet (20 mEq total) by mouth daily. 90 tablet 2  . rosuvastatin (CRESTOR) 10 MG tablet Take 1 tablet (10 mg total) by mouth daily. 90 tablet 2  . traZODone (DESYREL) 50 MG tablet Take 50 mg by mouth at bedtime.    . fluticasone furoate-vilanterol (BREO ELLIPTA) 100-25 MCG/INH AEPB Inhale 1 puff into the lungs daily. 28 each 0  . clopidogrel (PLAVIX) 75 MG tablet Take 75 mg by mouth daily. (Patient not taking: Reported on 12/24/2020)    . escitalopram (LEXAPRO) 20 MG tablet Take 20 mg by mouth daily.    Marland Kitchen lamoTRIgine (LAMICTAL) 25 MG tablet Take 25 mg by mouth daily.     No facility-administered medications prior to visit.     Review of Systems  Review of Systems  Constitutional: Negative.   Respiratory: Positive for cough. Negative for shortness of breath and wheezing.   Gastrointestinal:       Heartburn    Physical Exam  BP 116/64 (BP Location: Left Arm, Cuff Size: Normal)   Pulse (!) 46   Ht 5' 8.5" (1.74 m)   Wt 207 lb 3.2 oz (94 kg)   SpO2 97%   BMI 31.05 kg/m  Physical Exam Constitutional:      Appearance: Normal appearance.  HENT:     Head: Normocephalic and atraumatic.     Mouth/Throat:     Mouth: Mucous membranes are moist.     Pharynx: Oropharynx is clear.  Cardiovascular:     Rate and Rhythm: Regular rhythm. Bradycardia present.  Pulmonary:     Effort: Pulmonary effort is normal.     Breath sounds: Normal breath sounds.     Comments: CTA, diminished d/t poor effort  Musculoskeletal:     Cervical back: Normal range of motion and neck supple.     Comments: Limp d/t previous stroke  Skin:    General: Skin is warm and dry.  Neurological:     General: No focal deficit present.     Mental Status: She is alert and oriented to person, place, and time. Mental status is at baseline.  Psychiatric:        Mood and Affect: Mood normal.        Behavior: Behavior normal.        Thought Content: Thought content normal.        Judgment: Judgment normal.      Lab Results:  CBC    Component Value Date/Time   WBC 10.2 10/04/2020 1454   WBC 9.5 11/28/2018 1835   RBC 4.93 10/04/2020 1454   RBC 4.50 11/28/2018 1835   HGB 14.1 10/04/2020 1454   HCT 43.3 10/04/2020 1454   PLT 292 10/04/2020 1454   MCV 88 10/04/2020 1454   MCH 28.6 10/04/2020 1454   MCH 32.4 11/28/2018 1835   MCHC 32.6 10/04/2020 1454   MCHC 32.9 11/28/2018 1835   RDW 11.7 10/04/2020 1454   LYMPHSABS 1.8 10/04/2020 1454   MONOABS  0.4 11/28/2018 1835   EOSABS 0.1 10/04/2020 1454   BASOSABS 0.0 10/04/2020 1454    BMET    Component Value Date/Time   NA 146 (H) 10/07/2019 1445   K 3.5 10/07/2019 1445   CL 102  10/07/2019 1445   CO2 27 10/07/2019 1445   GLUCOSE 109 (H) 10/07/2019 1445   GLUCOSE 137 (H) 11/28/2018 1835   BUN 17 10/07/2019 1445   CREATININE 1.06 (H) 10/07/2019 1445   CALCIUM 9.1 10/07/2019 1445   GFRNONAA 62 10/07/2019 1445   GFRAA 72 10/07/2019 1445    BNP No results found for: BNP  ProBNP No results found for: PROBNP  Imaging: No results found.   Assessment & Plan:   Restrictive lung disease - PFTs on 12/07/20 showed moderate restriction with positive BD response. Normal diffusion capacity. Clinical symptoms and testing consistent with mild intermittent asthma and neuromuscular weakness d.t past stroke. Imaging did not reveal any underlying ILD or evidence of PE. Given trial Breo Ellipta 100, she has not noticed much of a difference on inhaler but had no significant symptoms prior except for oxygen desaturation. Repeat ambulatory walk today did not show any oxygen desaturations. CAT score 6, mMRC dyspnea scale was 1. Plan continue low dose ICS/LABA and IS exercises. FU in 3 months with Dr. Marchelle Gearing.    Glenford Bayley, NP 12/24/2020

## 2020-12-24 NOTE — Assessment & Plan Note (Addendum)
-   PFTs on 12/07/20 showed moderate restriction with positive BD response. Normal diffusion capacity. Clinical symptoms and testing consistent with mild intermittent asthma and neuromuscular weakness d.t past stroke. Imaging did not reveal any underlying ILD or evidence of PE. Given trial Breo Ellipta 100, she has not noticed much of a difference on inhaler but had no significant symptoms prior except for oxygen desaturation. Repeat ambulatory walk today did not show any oxygen desaturations. CAT score 6, mMRC dyspnea scale was 1. Plan continue low dose ICS/LABA and IS exercises. FU in 3 months with Dr. Marchelle Gearing.

## 2020-12-25 NOTE — Progress Notes (Unsigned)
Cardiology Office Note:    Date:  12/26/2020   ID:  [name], DOB [birth year], MRN [id number]  PCP:  Tamsen Roers, MD  Cardiologist:  Shirlee More, MD    Referring MD: Tamsen Roers, MD    ASSESSMENT:    1. S/P percutaneous patent foramen ovale closure   2. CAD in native artery   3. Essential hypertension   4. Dyslipidemia    PLAN:    In order of problems listed above:  1. She has done well good long-term result and she will transition of her clopidogrel to aspirin. 2. Stable having no anginal discomfort remote start the process of gradually withdrawing beta-blocker and I will reassess in the office in 6 weeks before discontinuing.  Continue other medical therapy including antiplatelet and high intensity statin 3. Blood pressure relatively low begin withdrawing her beta-blocker and follow-up in 6 weeks 4. Continue high intensity statin   Next appointment: 6 weeks   Medication Adjustments/Labs and Tests Ordered: Current medicines are reviewed at length with the patient today.  Concerns regarding medicines are outlined above.  No orders of the defined types were placed in this encounter.  No orders of the defined types were placed in this encounter.   Chief Complaint  Patient presents with  . Follow-up    With closure of PFO    History of Present Illness:    Jennifer Ali is a 50 y.o. female with a hx of patent foramen ovale with PFO closure 10/20/2019, hypertension and dyslipidemia.  She was last seen 11/30/2019.  History of CAD with remote PCI and stent 2005-2006 as well as a history of stroke. Compliance with diet, lifestyle and medications: Yes  Although she does not wheeze she has had exercise intolerance exertional shortness of breath when she walks in from the parking lot and she says she is better with a bronchodilator.  She takes a high dose beta-blocker and I told her I am not sure she needs it for blood pressure and will begin  withdrawing her beta-blocker which may help with her pulmonary disease.  Transition aspirin from clopidogrel for PFO closure No chest pain palpitation edema orthopnea syncope.  She has had no further neurologic event  She was seen by pulmonary Geraldo Pitter nurse practitioner 12/07/2020 was felt to have restrictive lung disease mild intermittent asthma and was placed on bronchodilator Breo 101 puff daily.  She did have some hypoxemia with overnight oximetry showing 5 minutes 40 seconds with a saturation of less than 88% but was not felt to require home oxygen therapy was advised to continue CPAP for obstructive sleep apnea. Past Medical History:  Diagnosis Date  . Anemia   . Borderline personality disorder ([place]) 07/09/2016  . Coronary artery disease   . Dyslipidemia 07/22/2015   Overview:  Low HDL  . Heart attack ([place])   . High cholesterol   . Hypertension   . Hypothyroidism   . Ischemic stroke ([place]) 01/31/2018  . Left leg numbness 11/02/2018  . Left leg pain 11/02/2018  . Major depressive disorder, recurrent episode, severe ([place]) 01/25/2019  . Obesity   . 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 s/p MI Dec. 2005. S/P PCI and CYpher stent LAD  2. No sig. disease by cath, June,2010  3.Stress echo negative for ischemia at 10 mets in June 2013  . Orthostatic hypotension 07/22/2015  . Patent foramen ovale    (a) by TEE 09/06/19 "small PFO with predominantly R to L  shunting across the atrial septum"  . Posttraumatic stress disorder 07/09/2016    Past Surgical History:  Procedure Laterality Date  . ABDOMINAL HYSTERECTOMY    . ANTERIOR CRUCIATE LIGAMENT REPAIR    . BUBBLE STUDY  09/06/2019   Procedure: BUBBLE STUDY;  Surgeon: Chilton Si, MD;  Location: [facility] ENDOSCOPY;  Service: Cardiovascular;;  . CARDIAC CATHETERIZATION    . CORONARY ANGIOPLASTY WITH STENT PLACEMENT    . DILATION AND CURETTAGE OF UTERUS    . PATENT FORAMEN OVALE(PFO) CLOSURE N/A 10/20/2019   Procedure:  PATENT FORAMEN OVALE (PFO) CLOSURE;  Surgeon: Tonny Bollman, MD;  Location[facility] INVASIVE CV LAB;  Service: Cardiovascular;  Laterality: N/A;  . TEE WITHOUT CARDIOVERSION N/A 09/06/2019   Procedure: TRANSESOPHAGEAL ECHOCARDIOGRAM (TEE);  Surgeon: Chilton Si, MD;  Location: [facility] ENDOSCOPY;  Service: Cardiovascular;  Laterality: N/A;  . TONSILLECTOMY      Current Medications: Current Meds  Medication Sig  . busPIRone (BUSPAR) 10 MG tablet Take 10 mg by mouth 3 (three) times daily.  . clopidogrel (PLAVIX) 75 MG tablet Take 75 mg by mouth daily.  . cyclobenzaprine (FLEXERIL) 5 MG tablet Take 5 mg by mouth every 6 (six) hours as needed. As needed  . FLUoxetine (PROZAC) 40 MG capsule Take 40 mg by mouth at bedtime.  . fluticasone furoate-vilanterol (BREO ELLIPTA) 100-25 MCG/INH AEPB Inhale 1 puff into the lungs daily.  Marland Kitchen gabapentin (NEURONTIN) 300 MG capsule Take 1 capsule (300 mg total) by mouth 3 (three) times daily.  Marland Kitchen HYDROcodone-acetaminophen (NORCO/VICODIN) 5-325 MG tablet Take 1 tablet by mouth every 6 (six) hours as needed for moderate pain.  Marland Kitchen levothyroxine (SYNTHROID) 150 MCG tablet Take 150 mcg by mouth daily before breakfast.  . metFORMIN (GLUCOPHAGE) 1000 MG tablet Take 1,000 mg by mouth 2 (two) times daily.  . metoprolol succinate (TOPROL-XL) 100 MG 24 hr tablet Take 100 mg by mouth daily.   . potassium chloride SA (KLOR-CON M20) 20 MEQ tablet Take 1 tablet (20 mEq total) by mouth daily.  . risperiDONE (RISPERDAL) 0.5 MG tablet Take 0.5 mg by mouth 2 (two) times daily.  . rosuvastatin (CRESTOR) 10 MG tablet Take 1 tablet (10 mg total) by mouth daily.  . traZODone (DESYREL) 100 MG tablet Take 100 mg by mouth at bedtime.  . [DISCONTINUED] traZODone (DESYREL) 50 MG tablet Take 50 mg by mouth at bedtime.     Allergies:   Sulfa antibiotics   Social History   Socioeconomic History  . Marital status: Married    Spouse name: Not on file  . Number of children: Not on file  .  Years of education: Not on file  . Highest education level: Not on file  Occupational History  . Not on file  Tobacco Use  . Smoking status: Former Smoker    Packs/day: 0.50    Years: 25.00    Pack years: 12.50    Types: Cigarettes  . Smokeless tobacco: Never Used  Vaping Use  . Vaping Use: Never used  Substance and Sexual Activity  . Alcohol use: Not Currently    Comment: occassionally  . Drug use: Not Currently    Types: Marijuana, Methamphetamines, Cocaine  . Sexual activity: Not on file  Other Topics Concern  . Not on file  Social History Narrative   Lives with mother, father and fmaily   Caffeine use: soda/tea daily   Right handed   Social Determinants of Health   Financial Resource Strain: Not on file  Food Insecurity: Not on  file  Transportation Needs: Not on file  Physical Activity: Not on file  Stress: Not on file  Social Connections: Not on file     Family History: The patient's family history includes Diabetes in her father and mother; Hyperlipidemia in her mother; Hypertension in her mother; Stroke in her mother. ROS:   Please see the history of present illness.    All other systems reviewed and are negative.  EKGs/Labs/Other Studies Reviewed:    The following studies were reviewed today:  EKG:  EKG ordered today and personally reviewed.  The ekg ordered today demonstrates sinus rhythm and is normal rate 50 bpm  Recent Labs: 10/04/2020: Hemoglobin 14.1; Platelets 292  Recent Lipid Panel No results found for: CHOL, TRIG, HDL, CHOLHDL, VLDL, LDLCALC, LDLDIRECT  Physical Exam:    VS:  BP 102/70   Pulse (!) 50   Ht 5' 8.5" (1.74 m)   Wt 205 lb (93 kg)   SpO2 95%   BMI 30.72 kg/m     Wt Readings from Last 3 Encounters:  12/26/20 205 lb (93 kg)  12/24/20 207 lb 3.2 oz (94 kg)  12/07/20 215 lb 3.2 oz (97.6 kg)     GEN:  Well nourished, well developed in no acute distress HEENT: Normal NECK: No JVD; No carotid bruits LYMPHATICS: No  lymphadenopathy CARDIAC: RRR, no murmurs, rubs, gallops RESPIRATORY:  Clear to auscultation without rales, wheezing or rhonchi  ABDOMEN: Soft, non-tender, non-distended MUSCULOSKELETAL:  No edema; No deformity  SKIN: Warm and dry NEUROLOGIC:  Alert and oriented x 3 PSYCHIATRIC:  Normal affect    Signed, Norman Herrlich, MD  12/26/2020 10:49 AM    [facility]

## 2020-12-26 ENCOUNTER — Encounter: Payer: Self-pay | Admitting: Cardiology

## 2020-12-26 ENCOUNTER — Ambulatory Visit (INDEPENDENT_AMBULATORY_CARE_PROVIDER_SITE_OTHER): Payer: Medicaid Other | Admitting: Cardiology

## 2020-12-26 ENCOUNTER — Other Ambulatory Visit: Payer: Self-pay

## 2020-12-26 VITALS — BP 102/70 | HR 50 | Ht 68.5 in | Wt 205.0 lb

## 2020-12-26 DIAGNOSIS — I10 Essential (primary) hypertension: Secondary | ICD-10-CM

## 2020-12-26 DIAGNOSIS — E785 Hyperlipidemia, unspecified: Secondary | ICD-10-CM

## 2020-12-26 DIAGNOSIS — Z8774 Personal history of (corrected) congenital malformations of heart and circulatory system: Secondary | ICD-10-CM | POA: Diagnosis not present

## 2020-12-26 DIAGNOSIS — I2510 Atherosclerotic heart disease of native coronary artery without angina pectoris: Secondary | ICD-10-CM

## 2020-12-26 MED ORDER — METOPROLOL SUCCINATE ER 25 MG PO TB24
12.5000 mg | ORAL_TABLET | Freq: Every day | ORAL | 3 refills | Status: DC
Start: 2020-12-26 — End: 2021-02-20

## 2020-12-26 MED ORDER — ASPIRIN EC 81 MG PO TBEC: 81.0000 mg | DELAYED_RELEASE_TABLET | Freq: Every day | ORAL | 3 refills | Status: AC

## 2020-12-26 NOTE — Patient Instructions (Signed)
Medication Instructions:  Your physician has recommended you make the following change in your medication:  STOP: Plavix DECREASE: TOPROL XL. Take 50 mg by mouth daily for two weeks. Then take 25 mg by mouth daily for two weeks. Then take 1/2 tablet =12.5 mg by mouth daily.  *If you need a refill on your cardiac medications before your next appointment, please call your pharmacy*   Lab Work: Your physician recommends that you return for lab work in: TODAY CMP, Lipids If you have labs (blood work) drawn today and your tests are completely normal, you will receive your results only by: Marland Kitchen MyChart Message (if you have MyChart) OR . A paper copy in the mail If you have any lab test that is abnormal or we need to change your treatment, we will call you to review the results.   Testing/Procedures: None   Follow-Up: At [facility], you and your health needs are our priority.  As part of our continuing mission to provide you with exceptional heart care, we have created designated Provider Care Teams.  These Care Teams include your primary Cardiologist (physician) and Advanced Practice Providers (APPs -  Physician Assistants and Nurse Practitioners) who all work together to provide you with the care you need, when you need it.  We recommend signing up for the patient portal called "MyChart".  Sign up information is provided on this After Visit Summary.  MyChart is used to connect with patients for Virtual Visits (Telemedicine).  Patients are able to view lab/test results, encounter notes, upcoming appointments, etc.  Non-urgent messages can be sent to your provider as well.   To learn more about what you can do with MyChart, go to [email or web address].    Your next appointment:   6 week(s)  The format for your next appointment:   In Person  Provider:   Norman Herrlich, MD   Other Instructions

## 2020-12-27 ENCOUNTER — Telehealth: Payer: Self-pay

## 2020-12-27 LAB — LIPID PANEL
Chol/HDL Ratio: 2.7 ratio (ref 0.0–4.4)
Cholesterol, Total: 93 mg/dL — ABNORMAL LOW (ref 100–199)
HDL: 35 mg/dL — ABNORMAL LOW (ref >39)
LDL Chol Calc (NIH): 37 mg/dL (ref 0–99)
Triglycerides: 111 mg/dL (ref 0–149)
VLDL Cholesterol Cal: 21 mg/dL (ref 5–40)

## 2020-12-27 LAB — COMPREHENSIVE METABOLIC PANEL
ALT: 21 IU/L (ref 0–32)
AST: 21 IU/L (ref 0–40)
Albumin/Globulin Ratio: 2.2 (ref 1.2–2.2)
Albumin: 4.8 g/dL (ref 3.8–4.8)
Alkaline Phosphatase: 66 IU/L (ref 44–121)
BUN/Creatinine Ratio: 11 (ref 9–23)
BUN: 10 mg/dL (ref 6–24)
Bilirubin Total: 0.5 mg/dL (ref 0.0–1.2)
CO2: 21 mmol/L (ref 20–29)
Calcium: 9.8 mg/dL (ref 8.7–10.2)
Chloride: 102 mmol/L (ref 96–106)
Creatinine, Ser: 0.89 mg/dL (ref 0.57–1.00)
GFR calc Af Amer: 87 mL/min/{1.73_m2} (ref >59)
GFR calc non Af Amer: 76 mL/min/{1.73_m2} (ref >59)
Globulin, Total: 2.2 g/dL (ref 1.5–4.5)
Glucose: 88 mg/dL (ref 65–99)
Potassium: 4.5 mmol/L (ref 3.5–5.2)
Sodium: 140 mmol/L (ref 134–144)
Total Protein: 7 g/dL (ref 6.0–8.5)

## 2020-12-27 NOTE — Telephone Encounter (Signed)
Spoke with patient regarding results and recommendation.  Patient verbalizes understanding and is agreeable to plan of care. Advised patient to call back with any issues or concerns.  

## 2020-12-27 NOTE — Telephone Encounter (Signed)
-----   Message from Baldo Daub, MD sent at 12/27/2020  8:06 AM EST ----- Stable results lipids are at target no change in treatment

## 2021-01-07 ENCOUNTER — Telehealth: Payer: Self-pay | Admitting: Primary Care

## 2021-01-07 NOTE — Telephone Encounter (Signed)
PA request was received from (pharmacy): [facility] in [place] Phone: [phone] Fax: [phone] Medication name and strength: Breo 100-14mcg Ordering Provider: Waynetta Sandy  Was PA started with [facility]?: Yes If yes, please enter KEY: [id number] Medication tried and failed: N/A Covered Alternatives: Per PA sheet, Spiriva respimat, Dulera, Anoro and Advair HFA.  Attempted PA for Breo 100-38mcg. Patient has not tried any other inhalers, therefore the PA would be denied. Her insurance will cover Spiriva respimat, Bevespi, Dulera, Anoro and Advair.   Beth, please advise if you are willing to switch her to one of the covered medications mentioned above. Thanks!

## 2021-01-08 MED ORDER — MOMETASONE FURO-FORMOTEROL FUM 100-5 MCG/ACT IN AERO: 2.0000 | INHALATION_SPRAY | Freq: Two times a day (BID) | RESPIRATORY_TRACT | 5 refills | Status: AC

## 2021-01-08 NOTE — Telephone Encounter (Signed)
Spoke with pt, aware of recs.  rx sent to preferred pharmacy, med list updated to reflect changes.  Nothing further needed at this time- will close encounter.

## 2021-01-08 NOTE — Telephone Encounter (Signed)
Can we change her to [facility] 100 two puffs twice daily

## 2021-02-06 ENCOUNTER — Other Ambulatory Visit: Payer: Self-pay

## 2021-02-06 ENCOUNTER — Ambulatory Visit (INDEPENDENT_AMBULATORY_CARE_PROVIDER_SITE_OTHER): Payer: Medicaid Other | Admitting: Internal Medicine

## 2021-02-06 ENCOUNTER — Encounter: Payer: Self-pay | Admitting: Internal Medicine

## 2021-02-06 VITALS — BP 100/70 | HR 73 | Temp 97.5°F | Ht 68.5 in | Wt 198.8 lb

## 2021-02-06 DIAGNOSIS — J4540 Moderate persistent asthma, uncomplicated: Secondary | ICD-10-CM

## 2021-02-06 MED ORDER — ALBUTEROL SULFATE HFA 108 (90 BASE) MCG/ACT IN AERS: 2.0000 | INHALATION_SPRAY | Freq: Four times a day (QID) | RESPIRATORY_TRACT | 6 refills | Status: AC | PRN

## 2021-02-06 NOTE — Addendum Note (Signed)
Addended by: Demetrio Lapping E on: 02/06/2021 12:23 PM   Modules accepted: Orders

## 2021-02-06 NOTE — Progress Notes (Signed)
OV 10/23/2020  Subjective:  Patient ID: Jennifer Ali, female , DOB: June 30, 1971 , age 50 y.o. , MRN: [id number] , ADDRESS: [address] [place] [address] PCP Jennifer Puffer, MD Patient Care Team: Jennifer Puffer, MD as PCP - General (Family Medicine) Jennifer Daub, MD as PCP - Cardiology (Cardiology) Jennifer Ali, Jennifer Foley, MD (Physical Medicine and Rehabilitation)  This Provider for this visit: Treatment Team:  Attending Provider: Kalman Shan, MD    10/23/2020 -   Chief Complaint  Patient presents with  . Consult    Hypoxia, HX of OSA     HPI Jennifer Ali 50 y.o. -referred by cardiology.  She tells me that she had a stroke and during the work-up of this a few years ago she was found to have patent foramen ovale.  She was status post PFO closure last year.  Since then overall she is doing well.  She recently saw the cardiologist and she tells me that at the time when they did a walking desaturation test she desaturated and therefore she has been referred here.  This was just 2 weeks ago.  She says since then she has become more self aware that she is short of breath.  In review of the cardiology note it appears that she is got insidious onset of shortness of breath for the last few months present on exertion relieved by rest but she is denying this at this point in time.  She does have sleep apnea.  She recollects a sleep test within the last year or 2.  She says is no nocturnal desaturations.  She is not on oxygen with the CPAP at home.  Dr. Vickey Ali is a sleep specialist.  There is no cough or wheezing  Today when we walked her she initially desaturated but as we continue to walk her she improved with her pulse ox.  Resting pulse ox was 95%.  Resting heart rate was 80/min.  She then dropped to 88% but quickly rebounded to 92%.  This happened 2 times.  She walked very slow and towards and was short of breath.  Final pulse ox was 91% with a  heart rate of 92/min.   Her recent labs are documented below.  Her exhaled nitric oxide test today  feno 14  Results for Jennifer Ali (MRN [id number]) as of 10/23/2020 14:47  Ref. Range 10/04/2020 14:54  Hemoglobin Latest Ref Range: 11.1 - 15.9 g/dL 44.8   Results for Jennifer Ali (MRN [id number]) as of 10/23/2020 14:47  Ref. Range 10/07/2019 14:45  Creatinine Latest Ref Range: 0.57 - 1.00 mg/dL 0.26 (H)   ROS - per HPI  Chest x-ray November 28, 2018: Personally visualized and agree with Dr. Ephriam Ali and the radiologist it looks clear.  Echocardiogram October 04, 2020 bubble study  IMPRESSIONS    1. S/P PFO closure; no residual shunt noted; negative saline  microcavitation study.  2. Left ventricular ejection fraction, by estimation, is 60 to 65%. The  left ventricle has normal function. The left ventricle has no regional  wall motion abnormalities. Left ventricular diastolic parameters are  indeterminate.  3. Right ventricular systolic function is normal. The right ventricular  size is normal.  4. The mitral valve is normal in structure. No evidence of mitral valve  regurgitation. No evidence of mitral stenosis.  5. The aortic valve is tricuspid. Aortic valve regurgitation is not  visualized. No aortic stenosis is present.  6. The inferior vena  cava is normal in size with greater than 50%  respiratory variability, suggesting right atrial pressure of 3 mmHg.  7. Agitated saline contrast bubble study was negative, with no evidence  of any interatrial shunt   RECENT HX 10/04/20 with cardiology Jennifer Ali is a 50 y.o. female with a history of OSA on CPAP, morbid obesity, hypothyroidism, carotid artery diease, recently diagnosed DMT2, CVA with residual gait instability, CAD s/p remote PCI and PFO s/p PFO closure (10/20/19) who presents to clinic for follow up.  She presented in 01/2018 with weakness and numbness down her left arm  and leg. She was treated in [facility] and was told that she had a stroke but she was well outside of the treatment window.Carotid duplex with 40% stenosis of L ICA. She underwent bubble study 08/26/19 with LVEF 60-65%, impaired LV relaxation, no significant valvular dysfunction, RV normal size and function, but notedscant immediate contrast passage across atrial septum.TEE 09/06/19 showed a small PFO with predominantly right to left shunting across the atrial septum and that the interatrial septum is aneurysmal.  She underwent successful transcatheter PFO closure with a 25 mm Amplatzer PFO occluderon 10/20/19. Post op echo showed EF 55-60%, normally functioning PFO occluder with no atrial level shunt by color flow doppler.  Today she presents to clinic for follow up. Doing okay. Recently diagnosed with DMT2 and started with Metfotmin. She has had ongoing shortness of breath for the past few months. No chest pain. No LE edema, orthopnea or PND. No dizziness or syncope. No blood in stool or urine. No palpitations. SHe does sleep with a CPAP. She is fully vaccinated with Moderna New onset hypoxia: pt has had worsening dyspnea over the past several months. 02 sats noted to be down to 91-93% with rest and 89% with ambulation. She is not tachycardic or tachypnic and review of echo today shows normal RV. She wears a CPAP at night. question OHS. Will check blood counts to rule out anemia and refer to pulm     12/07/2020 - Interim hx  Patient presents today for 6-week follow-up with PFTs. Originally referred by cardiology due to oxygen desaturation on ambulatory walk test.  She does have sleep apnea.  Not on oxygen with CPAP at home. Overnight oximetry study on 11/12/2020 showed pulse ox less than or equal to 88% at 5 minutes and 40 seconds.  Study results are borderline for oxygen need. Dr. Vickey Ali is sleep specialist. High-resolution CAT scan in November 2021 showed no evidence of fibrotic interstitial  lung disease.  No significant air trapping, pleural effusion or pneumothorax. She had a normal perfusion exam, no findings to suggest acute pulmonary embolism.    12/24/2020 - Interim hx Patient presents today for 3-4 week fu.  Pulmonary function testing showed moderate restriction with positive bronchodilator response.  Patient was given sample of Breo Ellipta 100 to use once daily in the morning along with incentive spirometer device to facilitate deep breathing exercises. She hasn't noticed much of a difference but she felt well prior to using inhaler. She has occasional dry cough which is not bothersome to her. She gets out of breath after over exerting herself but nothing drastic per patient. She has not had any wheezing. She has some new heart burn symptoms. CAT score 6, Dyspnea sclae 1.   Pulmonary function testing: 12/07/2020 - FVC 3.33 (81%), FEV1 2.55 (78%), ratio 77, DLCOunc 19.90 (81%) Moderate restriction with positive bronchodilator response. Normal diffusion capacity   ONO spent 5 mins  40 second with SpO2 level equal to or less than <88%. This is borderline for oxygen.   10/23/20- Resting pulse ox was 95%. Resting heart rate was 80/min.  She then dropped to 88% but quickly rebounded to 92%.  This happened 2 times.  She walked very slow and towards and was short of breath.  Final pulse ox was 91% with a heart rate of 92/min  12/24/2020 - Lowest O2 97% RA after walking 3 laps slow pace with limp d/t prior stroke  Imaging:  CT chest-  no evidence of fibrotic interstitial lung disease.  No significant air trapping, pleural effusion or pneumothorax. Left coronary artery calcifications and stents.  Normal heart size.  Normal perfusion exam, no findings to suggest acute pulmonary embolism.      OV 02/06/2021  Subjective:  Patient ID: Jennifer Ali, female , DOB: 02-Nov-1971 , age 69 y.o. , MRN: [id number] , ADDRESS: [address] [place] [address] PCP Jennifer Puffer, MD Patient Care Team: Jennifer Puffer, MD as PCP - General (Family Medicine) Jennifer Daub, MD as PCP - Cardiology (Cardiology) Jennifer Ali, Jennifer Foley, MD (Physical Medicine and Rehabilitation)  This Provider for this visit: Treatment Team:  Attending Provider: Kalman Shan, MD    02/06/2021 -   Chief Complaint  Patient presents with  . Follow-up    No complaints.   Clinical diagnosis of asthma (presented with a history of exertional hypoxemia post stroke.].  Started empirically on Breo mid December 2021 with improvement.  HPI  [facility] 50 y.o. -presents for follow-up.  Last seen in December 2021.  She initially was referred for exertional hypoxemia and documented by the cardiologist.  We could not reproduce it here other than the fact that her hypoxemia improved as she walked suggesting obesity atelectasis.  She had extensive work-up.  Not no clear pointers towards asthma.  Nevertheless she was given empiric inhaled corticosteroid long-acting beta agonist.  She started feeling better after that.  So she is being treated with the same.  Instead of Breo she is now taking Dulera.  At this point in time she says she is not sure if the inhaler is actually helping her.  She does not definitely feel worse.  She feels she is lost 60 pounds of weight since September 2021 intentionally and this is actually helped her.  She is interested in seeing if she can come off Dulera.  Last time we walked her and she did not desaturate.    Asthma Control Test ACT Total Score  02/06/2021 24   No results found for: NITRICOXIDE     PFT  PFT Results Latest Ref Rng & Units 12/07/2020  FVC-Pre L 3.09  FVC-Predicted Pre % 75  FVC-Post L 3.33  FVC-Predicted Post % 81  Pre FEV1/FVC % % 72  Post FEV1/FCV % % 77  FEV1-Pre L 2.24  FEV1-Predicted Pre % 68  FEV1-Post L 2.55  DLCO uncorrected ml/min/mmHg 19.90  DLCO UNC% % 81  DLCO corrected ml/min/mmHg 19.90  DLCO COR %Predicted %  81  DLVA Predicted % 105  TLC L 4.56  TLC % Predicted % 79  RV % Predicted % 81       has a past medical history of Anemia, Borderline personality disorder (HCC) (07/09/2016), Coronary artery disease, Dyslipidemia (07/22/2015), Heart attack (HCC), High cholesterol, Hypertension, Hypothyroidism, Ischemic stroke (HCC) (01/31/2018), Left leg numbness (11/02/2018), Left leg pain (11/02/2018), Major depressive disorder, recurrent episode, severe (HCC) (01/25/2019), Obesity, Old MI (myocardial  infarction) (07/22/2015), Orthostatic hypotension (07/22/2015), Patent foramen ovale, and Posttraumatic stress disorder (07/09/2016).   reports that she quit smoking about 5 years ago. Her smoking use included cigarettes. She has a 12.50 pack-year smoking history. She has never used smokeless tobacco.  Past Surgical History:  Procedure Laterality Date  . ABDOMINAL HYSTERECTOMY    . ANTERIOR CRUCIATE LIGAMENT REPAIR    . BUBBLE STUDY  09/06/2019   Procedure: BUBBLE STUDY;  Surgeon: Chilton Si, MD;  Location: [facility] ENDOSCOPY;  Service: Cardiovascular;;  . CARDIAC CATHETERIZATION    . CORONARY ANGIOPLASTY WITH STENT PLACEMENT    . DILATION AND CURETTAGE OF UTERUS    . PATENT FORAMEN OVALE(PFO) CLOSURE N/A 10/20/2019   Procedure: PATENT FORAMEN OVALE (PFO) CLOSURE;  Surgeon: Tonny Bollman, MD;  Location: [facility] INVASIVE CV LAB;  Service: Cardiovascular;  Laterality: N/A;  . TEE WITHOUT CARDIOVERSION N/A 09/06/2019   Procedure: TRANSESOPHAGEAL ECHOCARDIOGRAM (TEE);  Surgeon: Chilton Si, MD;  Location[facility] END[facility]COPY;  Service: Cardiovascular;  Laterality: N/A;  . TONSILLECTOMY      Allergies  Allergen Reactions  . Sulfa Antibiotics Hives    Immunization History  Administered Date(s) Administered  . Moderna Sars-Covid-2 Vaccination 08/21/2020, 09/18/2020    Family History  Problem Relation Age of Onset  . Stroke Mother   . Diabetes Mother   . Hypertension Mother   . Hyperlipidemia Mother   .  Diabetes Father      Current Outpatient Medications:  .  aspirin EC 81 MG tablet, Take 1 tablet (81 mg total) by mouth daily. Swallow whole., Disp: 90 tablet, Rfl: 3 .  cyclobenzaprine (FLEXERIL) 5 MG tablet, Take 5 mg by mouth every 6 (six) hours as needed. As needed, Disp: , Rfl:  .  FLUoxetine (PROZAC) 40 MG capsule, Take 40 mg by mouth at bedtime., Disp: , Rfl:  .  gabapentin (NEURONTIN) 300 MG capsule, Take 1 capsule (300 mg total) by mouth 3 (three) times daily., Disp: 270 capsule, Rfl: 4 .  HYDROcodone-acetaminophen (NORCO/VICODIN) 5-325 MG tablet, Take 1 tablet by mouth every 6 (six) hours as needed for moderate pain., Disp: , Rfl:  .  levothyroxine (SYNTHROID) 150 MCG tablet, Take 150 mcg by mouth daily before breakfast., Disp: , Rfl:  .  metFORMIN (GLUCOPHAGE) 1000 MG tablet, Take 1,000 mg by mouth 2 (two) times daily., Disp: , Rfl:  .  metoprolol succinate (TOPROL XL) 25 MG 24 hr tablet, Take 0.5 tablets (12.5 mg total) by mouth daily., Disp: 45 tablet, Rfl: 3 .  mometasone-formoterol (DULERA) 100-5 MCG/ACT AERO, Inhale 2 puffs into the lungs in the morning and at bedtime., Disp: 13 g, Rfl: 5 .  potassium chloride SA (KLOR-CON M20) 20 MEQ tablet, Take 1 tablet (20 mEq total) by mouth daily., Disp: 90 tablet, Rfl: 2 .  risperiDONE (RISPERDAL) 0.5 MG tablet, Take 0.5 mg by mouth 2 (two) times daily., Disp: , Rfl:  .  rosuvastatin (CRESTOR) 10 MG tablet, Take 1 tablet (10 mg total) by mouth daily., Disp: 90 tablet, Rfl: 2 .  traZODone (DESYREL) 100 MG tablet, Take 100 mg by mouth at bedtime., Disp: , Rfl:  .  lamoTRIgine (LAMICTAL) 100 MG tablet, Take 100 mg by mouth 2 (two) times daily., Disp: , Rfl:       Objective:   Vitals:   02/06/21 1128  BP: 100/70  Pulse: 73  Temp: (!) 97.5 F (36.4 C)  TempSrc: Temporal  SpO2: 96%  Weight: 198 lb 12.8 oz (90.2 kg)  Height: 5' 8.5" (1.74  m)    Estimated body mass index is 29.79 kg/m as calculated from the following:   Height as  of this encounter: 5' 8.5" (1.74 m).   Weight as of this encounter: 198 lb 12.8 oz (90.2 kg).  @WEIGHTCHANGE @    02/06/21 1128  Weight: 198 lb 12.8 oz (90.2 kg)     Physical Exam General: No distress. Looks well Neuro: Alert and Oriented x 3. GCS 15. Speech normal  Psych: Pleasant Resp:  Barrel Chest - no.  Wheeze - no, Crackles - no, No overt respiratory distress CVS: Normal heart sounds. Murmurs - no Ext: Stigmata of Connective Tissue Disease - no.  Uses cane HEENT: Normal upper airway. PEERL +. No post nasal drip.  Mallampati class III        Assessment:       ICD-10-CM   1. Moderate persistent asthma without complication  J45.40        Plan:     Patient Instructions     ICD-10-CM   1. Moderate persistent asthma without complication  J45.40      we have clinically called you as asthmatic based on the fact you start feeling better after starting empiric inhalers.  However I do agree with you that your presentation of exertional drop in oxygen is not the typical presenting manifestation of asthma.  Am glad you are feeling better overall.  I appreciate the fact that you are not sure if the inhaler is actually helping  Plan -Reduce taking Dulera to 2 puffs once daily at -Use albuterol as needed    follow-up -Do blood IgE and CBC with differential in 3 months -Do spirometry pre and postbronchodilator and DLCO in 3 months -Do exhaled nitric oxide/Fino test in 3 months -Return to see Dr. 02/08/21 or nurse practitioner in 3 months- > and based on response we can consider further reductions or stopping Dulera altogether      SIGNATURE    Dr. Marchelle Gearing, M.D., F.C.C.P,  Pulmonary and Critical Care Medicine Staff Physician, [facility] Director - Interstitial Lung Disease  Program  Pulmonary Fibrosis [facility] [place], [place], [place]  Pager: [phone], If no answer or between   15:00h - 7:00h: call [phone] Telephone: [phone]  12:15 PM 02/06/2021

## 2021-02-06 NOTE — Addendum Note (Signed)
Addended by: Delrae Rend on: 02/06/2021 12:25 PM   Modules accepted: Orders

## 2021-02-06 NOTE — Addendum Note (Signed)
Addended by: Delrae Rend on: 02/06/2021 12:21 PM   Modules accepted: Orders

## 2021-02-06 NOTE — Patient Instructions (Addendum)
ICD-10-CM   1. Moderate persistent asthma without complication  J45.40      we have clinically called you as asthmatic based on the fact you start feeling better after starting empiric inhalers.  However I do agree with you that your presentation of exertional drop in oxygen is not the typical presenting manifestation of asthma.  Am glad you are feeling better overall.  I appreciate the fact that you are not sure if the inhaler is actually helping  Plan -Reduce taking Dulera to 2 puffs once daily at -Use albuterol as needed    follow-up -Do blood IgE and CBC with differential in 3 months -Do spirometry pre and postbronchodilator and DLCO in 3 months -Do exhaled nitric oxide/Fino test in 3 months -Return to see Dr. Marchelle Gearing or nurse practitioner in 3 months- > and based on response we can consider further reductions or stopping Dulera altogether

## 2021-02-08 ENCOUNTER — Other Ambulatory Visit: Payer: Self-pay | Admitting: Cardiology

## 2021-02-11 NOTE — Telephone Encounter (Signed)
Rx refill sent to pharmacy. 

## 2021-02-12 ENCOUNTER — Ambulatory Visit: Payer: Medicaid Other | Admitting: Cardiology
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
## 2021-02-19 ENCOUNTER — Encounter: Payer: Self-pay | Admitting: Neurology

## 2021-02-19 ENCOUNTER — Ambulatory Visit (INDEPENDENT_AMBULATORY_CARE_PROVIDER_SITE_OTHER): Payer: Medicaid Other | Admitting: Neurology

## 2021-02-19 VITALS — BP 115/67 | HR 61 | Ht 68.0 in | Wt 197.0 lb

## 2021-02-19 DIAGNOSIS — R269 Unspecified abnormalities of gait and mobility: Secondary | ICD-10-CM | POA: Diagnosis not present

## 2021-02-19 DIAGNOSIS — I69354 Hemiplegia and hemiparesis following cerebral infarction affecting left non-dominant side: Secondary | ICD-10-CM | POA: Diagnosis not present

## 2021-02-19 DIAGNOSIS — G4733 Obstructive sleep apnea (adult) (pediatric): Secondary | ICD-10-CM | POA: Diagnosis not present

## 2021-02-19 DIAGNOSIS — I639 Cerebral infarction, unspecified: Secondary | ICD-10-CM | POA: Diagnosis not present

## 2021-02-19 DIAGNOSIS — Z9989 Dependence on other enabling machines and devices: Secondary | ICD-10-CM

## 2021-02-19 MED ORDER — GABAPENTIN 300 MG PO CAPS: 300.0000 mg | ORAL_CAPSULE | Freq: Three times a day (TID) | ORAL | 1 refills | Status: DC

## 2021-02-19 NOTE — Patient Instructions (Signed)
Order Mask refit for CPAP Send for PT  Continue gabapentin  See you back in 6 months

## 2021-02-19 NOTE — Progress Notes (Signed)
Cardiology Office Note:    Date:  02/20/2021   ID:  Jennifer Ali, DOB 09/26/1971, MRN [id number]  PCP:  Aida Puffer, MD  Cardiologist:  Norman Herrlich, MD    Referring MD: Aida Puffer, MD    ASSESSMENT:    1. S/P percutaneous patent foramen ovale closure   2. CAD in native artery   3. Essential hypertension   4. Dyslipidemia    PLAN:    In order of problems listed above:  1. Stable she is on antiplatelet therapy no recurrent neurologic event think she requires any cardiac imaging at this time 2. Stable CAD New [place] Heart Association class I having no angina and with bradycardia sinus and hypotension discontinue her low-dose beta-blocker continue aspirin and high intensity statin. 3. Discontinue beta-blocker with symptomatic hypotension presently not on antihypertensive agents 4. Lipids are ideal high risk individual LDL less than 50 continue her high intensity statin   Next appointment: 1 year   Medication Adjustments/Labs and Tests Ordered: Current medicines are reviewed at length with the patient today.  Concerns regarding medicines are outlined above.  No orders of the defined types were placed in this encounter.  No orders of the defined types were placed in this encounter.   Complaint: Follow-up CAD, should I stop my beta-blocker?  History of Present Illness:    Jennifer Ali is a 50 y.o. female with a hx of PFO closure 10/20/2019 hypertension dyslipidemia and CAD with remote PCI and stent and history of stroke last seen 12/27/2019.  She has been seen by pulmonary and has asthma and restrictive lung disease by neurology 02/19/2021 with worsening gait abnormality urinary and bowel incontinence she was evaluated with MRI ENG nerve conduction studies and was felt to have predominantly functional complaints likely related to deconditioning referred to physical therapy modalities.  She has also treated for sleep apnea.  Compliance with diet,  lifestyle and medications: Yes  She is beginning physical therapy and is optimistic her gait will improve. She had one episode of syncope associated with a blood pressure check at home systolic in the 90s. She is tolerating reduced beta-blocker and I think we should stop it with her episode of symptomatic bradycardia. She has had no chest pain edema shortness of breath palpitation or syncope. She tolerates her statin without muscle pain or weakness Past Medical History:  Diagnosis Date  . Anemia   . Borderline personality disorder (HCC) 07/09/2016  . Coronary artery disease   . Dyslipidemia 07/22/2015   Overview:  Low HDL  . Heart attack (HCC)   . High cholesterol   . Hypertension   . Hypothyroidism   . Ischemic stroke (HCC) 01/31/2018  . Left leg numbness 11/02/2018  . Left leg pain 11/02/2018  . Major depressive disorder, recurrent episode, severe (HCC) 01/25/2019  . Obesity   . 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 s/p MI Dec. 2005. S/P PCI and CYpher stent LAD  2. No sig. disease by cath, June,2010  3.Stress echo negative for ischemia at 10 mets in June 2013  . Orthostatic hypotension 07/22/2015  . Patent foramen ovale    (a) by TEE 09/06/19 "small PFO with predominantly R to L shunting across the atrial septum"  . Posttraumatic stress disorder 07/09/2016    Past Surgical History:  Procedure Laterality Date  . ABDOMINAL HYSTERECTOMY    . ANTERIOR CRUCIATE LIGAMENT REPAIR    . BUBBLE STUDY  09/06/2019   Procedure: BUBBLE STUDY;  Surgeon: Duke Salvia,  Elmarie Shiley, MD;  Location: [facility] ENDOSCOPY;  Service: Cardiovascular;;  . CARDIAC CATHETERIZATION    . CORONARY ANGIOPLASTY WITH STENT PLACEMENT    . DILATION AND CURETTAGE OF UTERUS    . PATENT FORAMEN OVALE(PFO) CLOSURE N/A 10/20/2019   Procedure: PATENT FORAMEN OVALE (PFO) CLOSURE;  Surgeon: Tonny Bollman, MD;  Location: [facility] INVASIVE CV LAB;  Service: Cardiovascular;  Laterality: N/A;  . TEE WITHOUT CARDIOVERSION N/A  09/06/2019   Procedure: TRANSESOPHAGEAL ECHOCARDIOGRAM (TEE);  Surgeon: Chilton Si, MD;  Location: [facility] ENDOSCOPY;  Service: Cardiovascular;  Laterality: N/A;  . TONSILLECTOMY      Current Medications: Current Meds  Medication Sig  . albuterol (VENTOLIN HFA) 108 (90 Base) MCG/ACT inhaler Inhale 2 puffs into the lungs every 6 (six) hours as needed for wheezing or shortness of breath.  Marland Kitchen aspirin EC 81 MG tablet Take 1 tablet (81 mg total) by mouth daily. Swallow whole.  . cyclobenzaprine (FLEXERIL) 5 MG tablet Take 5 mg by mouth every 6 (six) hours as needed. As needed  . FLUoxetine (PROZAC) 40 MG capsule Take 40 mg by mouth at bedtime.  . gabapentin (NEURONTIN) 300 MG capsule Take 1 capsule (300 mg total) by mouth 3 (three) times daily.  Marland Kitchen HYDROcodone-acetaminophen (NORCO/VICODIN) 5-325 MG tablet Take 1 tablet by mouth every 6 (six) hours as needed for moderate pain.  Marland Kitchen lamoTRIgine (LAMICTAL) 100 MG tablet Take 100 mg by mouth 2 (two) times daily.  Marland Kitchen levothyroxine (SYNTHROID) 150 MCG tablet Take 150 mcg by mouth daily before breakfast.  . LORazepam (ATIVAN) 0.5 MG tablet Take 1 tablet by mouth daily as needed.  . metFORMIN (GLUCOPHAGE) 1000 MG tablet Take 1,000 mg by mouth 2 (two) times daily.  . metoprolol succinate (TOPROL XL) 25 MG 24 hr tablet Take 0.5 tablets (12.5 mg total) by mouth daily.  . mometasone-formoterol (DULERA) 100-5 MCG/ACT AERO Inhale 2 puffs into the lungs in the morning and at bedtime.  . potassium chloride SA (KLOR-CON) 20 MEQ tablet Take 1 tablet by mouth once daily  . risperiDONE (RISPERDAL) 0.5 MG tablet Take 0.5 mg by mouth 2 (two) times daily.  . rosuvastatin (CRESTOR) 10 MG tablet Take 1 tablet (10 mg total) by mouth daily.  . traZODone (DESYREL) 100 MG tablet Take 100 mg by mouth at bedtime.     Allergies:   Sulfa antibiotics   Social History   Socioeconomic History  . Marital status: Married    Spouse name: Not on file  . Number of children: Not on  file  . Years of education: Not on file  . Highest education level: Not on file  Occupational History  . Not on file  Tobacco Use  . Smoking status: Former Smoker    Packs/day: 0.50    Years: 25.00    Pack years: 12.50    Types: Cigarettes    Quit date: 02/07/2016    Years since quitting: 5.0  . Smokeless tobacco: Never Used  Vaping Use  . Vaping Use: Never used  Substance and Sexual Activity  . Alcohol use: Not Currently    Comment: occassionally  . Drug use: Not Currently    Types: Marijuana, Methamphetamines, Cocaine  . Sexual activity: Not on file  Other Topics Concern  . Not on file  Social History Narrative   Lives with mother, father and fmaily   Caffeine use: soda/tea daily   Right handed   Social Determinants of Health   Financial Resource Strain: Not on file  Food Insecurity: Not on  file  Transportation Needs: Not on file  Physical Activity: Not on file  Stress: Not on file  Social Connections: Not on file     Family History: The patient's family history includes Diabetes in her father and mother; Hyperlipidemia in her mother; Hypertension in her mother; Stroke in her mother. ROS:   Please see the history of present illness.    All other systems reviewed and are negative.  EKGs/Labs/Other Studies Reviewed:    The following studies were reviewed today:  EKG: EKG 12/26/2020 independently reviewed sinus bradycardia 50 bpm otherwise normal  Recent Labs: 10/04/2020: Hemoglobin 14.1; Platelets 292 12/26/2020: ALT 21; BUN 10; Creatinine, Ser 0.89; Potassium 4.5; Sodium 140  Recent Lipid Panel her LDL cholesterol is ideal    Component Value Date/Time   CHOL 93 (L) 12/26/2020 1057   TRIG 111 12/26/2020 1057   HDL 35 (L) 12/26/2020 1057   CHOLHDL 2.7 12/26/2020 1057   LDLCALC 37 12/26/2020 1057    Physical Exam:    VS:  BP 106/70   Pulse 64   Ht 5\' 8"  (1.727 m)   Wt 196 lb (88.9 kg)   SpO2 97%   BMI 29.80 kg/m     Wt Readings from Last 3  Encounters:  02/20/21 196 lb (88.9 kg)  02/19/21 197 lb (89.4 kg)  02/06/21 198 lb 12.8 oz (90.2 kg)     GEN:  Well nourished, well developed in no acute distress HEENT: Normal NECK: No JVD; No carotid bruits LYMPHATICS: No lymphadenopathy CARDIAC: RRR, no murmurs, rubs, gallops RESPIRATORY:  Clear to auscultation without rales, wheezing or rhonchi  ABDOMEN: Soft, non-tender, non-distended MUSCULOSKELETAL:  No edema; No deformity  SKIN: Warm and dry NEUROLOGIC:  Alert and oriented x 3 PSYCHIATRIC:  Normal affect    Signed, 02/08/21, MD  02/20/2021 8:30 AM    [facility]

## 2021-02-20 ENCOUNTER — Other Ambulatory Visit: Payer: Self-pay

## 2021-02-20 ENCOUNTER — Ambulatory Visit (INDEPENDENT_AMBULATORY_CARE_PROVIDER_SITE_OTHER): Payer: Medicaid Other | Admitting: Cardiology

## 2021-02-20 ENCOUNTER — Encounter: Payer: Self-pay | Admitting: Cardiology

## 2021-02-20 VITALS — BP 106/70 | HR 64 | Ht 68.0 in | Wt 196.0 lb

## 2021-02-20 DIAGNOSIS — Z8774 Personal history of (corrected) congenital malformations of heart and circulatory system: Secondary | ICD-10-CM

## 2021-02-20 DIAGNOSIS — E785 Hyperlipidemia, unspecified: Secondary | ICD-10-CM | POA: Diagnosis not present

## 2021-02-20 DIAGNOSIS — I10 Essential (primary) hypertension: Secondary | ICD-10-CM | POA: Diagnosis not present

## 2021-02-20 DIAGNOSIS — I2510 Atherosclerotic heart disease of native coronary artery without angina pectoris: Secondary | ICD-10-CM

## 2021-02-20 MED ORDER — NITROGLYCERIN 0.4 MG SL SUBL: 0.4000 mg | SUBLINGUAL_TABLET | SUBLINGUAL | 3 refills | Status: DC | PRN

## 2021-02-20 NOTE — Patient Instructions (Signed)
Medication Instructions:  Your physician has recommended you make the following change in your medication:  STOP: Metoprolol  *If you need a refill on your cardiac medications before your next appointment, please call your pharmacy*   Lab Work: None If you have labs (blood work) drawn today and your tests are completely normal, you will receive your results only by: Marland Kitchen MyChart Message (if you have MyChart) OR . A paper copy in the mail If you have any lab test that is abnormal or we need to change your treatment, we will call you to review the results.   Testing/Procedures: None   Follow-Up: At [facility], you and your health needs are our priority.  As part of our continuing mission to provide you with exceptional heart care, we have created designated Provider Care Teams.  These Care Teams include your primary Cardiologist (physician) and Advanced Practice Providers (APPs -  Physician Assistants and Nurse Practitioners) who all work together to provide you with the care you need, when you need it.  We recommend signing up for the patient portal called "MyChart".  Sign up information is provided on this After Visit Summary.  MyChart is used to connect with patients for Virtual Visits (Telemedicine).  Patients are able to view lab/test results, encounter notes, upcoming appointments, etc.  Non-urgent messages can be sent to your provider as well.   To learn more about what you can do with MyChart, go to [email or web address].    Your next appointment:   1 year(s)  The format for your next appointment:   In Person  Provider:   Rosanne Sack, MD   Other Instructions

## 2021-02-21 NOTE — Progress Notes (Signed)
Cm sent to [facility] 

## 2021-05-20 ENCOUNTER — Other Ambulatory Visit: Payer: Self-pay | Admitting: Physician Assistant

## 2021-05-20 DIAGNOSIS — Q2112 Patent foramen ovale: Secondary | ICD-10-CM

## 2021-05-20 DIAGNOSIS — Q211 Atrial septal defect: Secondary | ICD-10-CM

## 2021-08-14 ENCOUNTER — Telehealth: Payer: Self-pay | Admitting: Cardiology

## 2021-08-14 ENCOUNTER — Other Ambulatory Visit: Payer: Self-pay | Admitting: Neurology

## 2021-08-14 DIAGNOSIS — I10 Essential (primary) hypertension: Secondary | ICD-10-CM

## 2021-08-14 MED ORDER — POTASSIUM CHLORIDE CRYS ER 20 MEQ PO TBCR
20.0000 meq | EXTENDED_RELEASE_TABLET | Freq: Every day | ORAL | 2 refills | Status: DC
Start: 2021-08-14 — End: 2022-04-29

## 2021-08-14 NOTE — Telephone Encounter (Signed)
RX sent

## 2021-08-14 NOTE — Telephone Encounter (Signed)
*  STAT* If patient is at the pharmacy, call can be transferred to refill team.   1. Which medications need to be refilled? (please list name of each medication and dose if known) potassium chloride SA (KLOR-CON) 20 MEQ tablet  2. Which pharmacy/location (including street and city if local pharmacy) is medication to be sent to?  [id number] - [place], [place] - [address]. Do they need a 30 day or 90 day supply? 90 day supply

## 2021-08-22 ENCOUNTER — Ambulatory Visit: Payer: Medicaid Other | Admitting: Adult Health

## 2021-10-03 NOTE — Progress Notes (Signed)
PATIENT: Jennifer Ali DOB: [birth year]  REASON FOR VISIT: follow up HISTORY FROM: patient   HISTORY OF PRESENT ILLNESS: Today 10/07/21 Jennifer Ali is a 50 y.o. female with a history of OSA on CPAP, embolic stroke, hemiparesis on left side, and gait abnormalities. She is here today for a follow up on her CPAP. Her download below demonstrates good compliance with her machine. Her treatment is good on the auto titration between 7 and 15 cmH2O with an AHI of 1.5. The patient denies any concerns at this time.  She continues to take aspirin for antiplatelet therapy. Ambulates with a knee brace and cane at this time. No recent falls.    HISTORY  03/26/20: Jennifer Ali is a 50 year old female with a history of obstructive sleep apnea on CPAP.  Download indicates that she used her machine 30 out of 30 days for compliance of 100%.  She used her machine greater than 4 hours 29 days for compliance of 97%.  On average she uses her machine 6 hours and 8 minutes.  Her residual AHI is 1 on 7 to 15 cm of water with EPR 3.  Leak in the 95th percentile is 46.2 L/min.  Reports that she does feel her mask leaking at night.  Reports that she takes her dentures out at night and she feels that this is causing the mask to leak.       REVIEW OF SYSTEMS: Out of a complete 14 system review of symptoms, the patient complains only of the following symptoms, and all other reviewed systems are negative.  FSS ESS 4  ALLERGIES: Allergies  Allergen Reactions   Sulfa Antibiotics Hives    HOME MEDICATIONS: Outpatient Medications Prior to Visit  Medication Sig Dispense Refill   albuterol (VENTOLIN HFA) 108 (90 Base) MCG/ACT inhaler Inhale 2 puffs into the lungs every 6 (six) hours as needed for wheezing or shortness of breath. 8 g 6   aspirin EC 81 MG tablet Take 1 tablet (81 mg total) by mouth daily. Swallow whole. 90 tablet 3   cyclobenzaprine (FLEXERIL) 5 MG tablet Take 5 mg by mouth every 6  (six) hours as needed. As needed     FLUoxetine (PROZAC) 40 MG capsule Take 40 mg by mouth at bedtime.     gabapentin (NEURONTIN) 300 MG capsule TAKE 1 CAPSULE BY MOUTH THREE TIMES DAILY 270 capsule 1   HYDROcodone-acetaminophen (NORCO/VICODIN) 5-325 MG tablet Take 1 tablet by mouth every 6 (six) hours as needed for moderate pain.     lamoTRIgine (LAMICTAL) 100 MG tablet Take 100 mg by mouth 2 (two) times daily.     levothyroxine (SYNTHROID) 150 MCG tablet Take 150 mcg by mouth daily before breakfast.     LORazepam (ATIVAN) 0.5 MG tablet Take 1 tablet by mouth daily as needed.     metFORMIN (GLUCOPHAGE) 1000 MG tablet Take 1,000 mg by mouth 2 (two) times daily.     mometasone-formoterol (DULERA) 100-5 MCG/ACT AERO Inhale 2 puffs into the lungs in the morning and at bedtime. 13 g 5   nitroGLYCERIN (NITROSTAT) 0.4 MG SL tablet Place 1 tablet (0.4 mg total) under the tongue every 5 (five) minutes as needed for chest pain. Up to 3 times. 90 tablet 3   potassium chloride SA (KLOR-CON) 20 MEQ tablet Take 1 tablet (20 mEq total) by mouth daily. 90 tablet 2   risperiDONE (RISPERDAL) 0.5 MG tablet Take 0.5 mg by mouth 2 (two) times daily.     rosuvastatin (  CRESTOR) 10 MG tablet Take 1 tablet (10 mg total) by mouth daily. 90 tablet 2   traZODone (DESYREL) 100 MG tablet Take 100 mg by mouth at bedtime.     No facility-administered medications prior to visit.    PAST MEDICAL HISTORY: Past Medical History:  Diagnosis Date   Anemia    Borderline personality disorder (HCC) 07/09/2016   Coronary artery disease    Dyslipidemia 07/22/2015   Overview:  Low HDL   Heart attack (HCC)    High cholesterol    Hypertension    Hypothyroidism    Ischemic stroke (HCC) 01/31/2018   Left leg numbness 11/02/2018   Left leg pain 11/02/2018   Major depressive disorder, recurrent episode, severe (HCC) 01/25/2019   Obesity    Old MI (myocardial infarction) 07/22/2015   Overview:  1. s/p MI Dec. 2005. S/P PCI and CYpher  stent LAD  2. No sig. disease by cath, June,2010  3.Stress echo negative for ischemia at 10 mets in June 2013   Orthostatic hypotension 07/22/2015   Patent foramen ovale    (a) by TEE 09/06/19 "small PFO with predominantly R to L shunting across the atrial septum"   Posttraumatic stress disorder 07/09/2016    PAST SURGICAL HISTORY: Past Surgical History:  Procedure Laterality Date   ABDOMINAL HYSTERECTOMY     ANTERIOR CRUCIATE LIGAMENT REPAIR     BUBBLE STUDY  09/06/2019   Procedure: BUBBLE STUDY;  Surgeon: Chilton Si, MD;  Location: [facility] ENDOSCOPY;  Service: Cardiovascular;;   CARDIAC CATHETERIZATION     CORONARY ANGIOPLASTY WITH STENT PLACEMENT     DILATION AND CURETTAGE OF UTERUS     PATENT FORAMEN OVALE(PFO) CLOSURE N/A 10/20/2019   Procedure: PATENT FORAMEN OVALE (PFO) CLOSURE;  Surgeon: Tonny Bollman, MD;  Location: [facility] INVASIVE CV LAB;  Service: Cardiovascular;  Laterality: N/A;   TEE WITHOUT CARDIOVERSION N/A 09/06/2019   Procedure: TRANSESOPHAGEAL ECHOCARDIOGRAM (TEE);  Surgeon: Chilton Si, MD;  Location: [facility] ENDOSCOPY;  Service: Cardiovascular;  Laterality: N/A;   TONSILLECTOMY      FAMILY HISTORY: Family History  Problem Relation Age of Onset   Stroke Mother    Diabetes Mother    Hypertension Mother    Hyperlipidemia Mother    Diabetes Father     SOCIAL HISTORY: Social History   Socioeconomic History   Marital status: Married    Spouse name: Not on file   Number of children: Not on file   Years of education: Not on file   Highest education level: Not on file  Occupational History   Not on file  Tobacco Use   Smoking status: Former    Packs/day: 0.50    Years: 25.00    Pack years: 12.50    Types: Cigarettes    Quit date: 02/07/2016    Years since quitting: 5.6   Smokeless tobacco: Never  Vaping Use   Vaping Use: Never used  Substance and Sexual Activity   Alcohol use: Not Currently    Comment: occassionally   Drug use: Not Currently     Types: Marijuana, Methamphetamines, Cocaine   Sexual activity: Not on file  Other Topics Concern   Not on file  Social History Narrative   Lives with mother, father and fmaily   Caffeine use: soda/tea daily   Right handed   Social Determinants of Health   Financial Resource Strain: Not on file  Food Insecurity: Not on file  Transportation Needs: Not on file  Physical Activity: Not on file  Stress:  Not on file  Social Connections: Not on file  Intimate Partner Violence: Not on file      PHYSICAL EXAM  Vitals:   10/07/21 1343  BP: 122/80  Pulse: 67   There is no height or weight on file to calculate BMI.  Generalized: Well developed, in no acute distress  Chest: Lungs clear to auscultation bilaterally  Neurological examination  Mentation: Alert oriented to time, place, history taking. Follows all commands speech and language fluent Cranial nerve II-XII: Extraocular movements were full, visual field were full on confrontational test Head turning and shoulder shrug  were normal and symmetric. Motor: The motor testing reveals 5 over 5 strength of all 4 extremities. Good symmetric motor tone is noted throughout.  Sensory: Sensory testing is intact to soft touch on all 4 extremities. No evidence of extinction is noted.  Gait and station: Gait is normal.    DIAGNOSTIC DATA (LABS, IMAGING, TESTING) - I reviewed patient records, labs, notes, testing and imaging myself where available.  Lab Results  Component Value Date   WBC 10.2 10/04/2020   HGB 14.1 10/04/2020   HCT 43.3 10/04/2020   MCV 88 10/04/2020   PLT 292 10/04/2020      Component Value Date/Time   NA 140 12/26/2020 1057   K 4.5 12/26/2020 1057   CL 102 12/26/2020 1057   CO2 21 12/26/2020 1057   GLUCOSE 88 12/26/2020 1057   GLUCOSE 137 (H) 11/28/2018 1835   BUN 10 12/26/2020 1057   CREATININE 0.89 12/26/2020 1057   CALCIUM 9.8 12/26/2020 1057   PROT 7.0 12/26/2020 1057   ALBUMIN 4.8 12/26/2020 1057    AST 21 12/26/2020 1057   ALT 21 12/26/2020 1057   ALKPHOS 66 12/26/2020 1057   BILITOT 0.5 12/26/2020 1057   GFRNONAA 76 12/26/2020 1057   GFRAA 87 12/26/2020 1057   Lab Results  Component Value Date   CHOL 93 (L) 12/26/2020   HDL 35 (L) 12/26/2020   LDLCALC 37 12/26/2020   TRIG 111 12/26/2020   CHOLHDL 2.7 12/26/2020     ASSESSMENT AND PLAN 50 y.o. year old female  has a past medical history of Anemia, Borderline personality disorder (HCC) (07/09/2016), Coronary artery disease, Dyslipidemia (07/22/2015), Heart attack (HCC), High cholesterol, Hypertension, Hypothyroidism, Ischemic stroke (HCC) (01/31/2018), Left leg numbness (11/02/2018), Left leg pain (11/02/2018), Major depressive disorder, recurrent episode, severe (HCC) (01/25/2019), Obesity, Old MI (myocardial infarction) (07/22/2015), Orthostatic hypotension (07/22/2015), Patent foramen ovale, and Posttraumatic stress disorder (07/09/2016). here with:  OSA on CPAP  - CPAP compliance excellent - Good treatment of AHI  - Encourage patient to use CPAP nightly and > 4 hours each night  2. Ischemic embolic stroke  -Continue aspirin daily for antiplatelet therapy.  - continue FU with PCP for risk factor managment  - F/U in 1 year or sooner if needed    Butch Penny, MSN, NP-C 10/07/2021, 1:34 PM [facility] [address] [place], [place] [address] [phone]

## 2021-10-07 ENCOUNTER — Encounter: Payer: Self-pay | Admitting: Adult Health

## 2021-10-07 ENCOUNTER — Ambulatory Visit: Payer: Medicaid Other | Admitting: Adult Health

## 2021-10-07 VITALS — BP 122/80 | HR 67

## 2021-10-07 DIAGNOSIS — I639 Cerebral infarction, unspecified: Secondary | ICD-10-CM | POA: Diagnosis not present

## 2021-10-07 DIAGNOSIS — Z9989 Dependence on other enabling machines and devices: Secondary | ICD-10-CM | POA: Diagnosis not present

## 2021-10-07 DIAGNOSIS — G4733 Obstructive sleep apnea (adult) (pediatric): Secondary | ICD-10-CM

## 2021-10-07 DIAGNOSIS — I69354 Hemiplegia and hemiparesis following cerebral infarction affecting left non-dominant side: Secondary | ICD-10-CM

## 2021-12-18 ENCOUNTER — Ambulatory Visit: Payer: Medicaid Other | Admitting: Adult Health

## 2022-02-10 ENCOUNTER — Other Ambulatory Visit: Payer: Self-pay | Admitting: Neurology

## 2022-02-10 NOTE — Telephone Encounter (Signed)
Rx refilled.

## 2022-03-03 ENCOUNTER — Ambulatory Visit: Payer: Medicaid Other | Admitting: Cardiology

## 2022-04-07 NOTE — Progress Notes (Signed)
?Cardiology Office Note:   ? ?Date:  04/08/2022  ? ?ID:  Jennifer Ali, DOB 07-09-1971, MRN [id number] ? ?PCP:  Aida Puffer, MD  ?Cardiologist:  Norman Herrlich, MD   ? ?Referring MD: Aida Puffer, MD  ? ? ?ASSESSMENT:   ? ?1. Patent foramen ovale   ?2. S/P patent foramen ovale closure   ?3. CAD in native artery   ?4. Essential hypertension   ?5. Dyslipidemia   ?6. Type 2 diabetes mellitus without complication, unspecified whether long term insulin use (HCC)   ? ?PLAN:   ? ?In order of problems listed above: ? ?She has had a good result closure of a PFO after stroke no residual shunt and I think we need to repeat imaging and antiplatelet therapy of aspirin is appropriate. ?Stable CAD no anginal discomfort following remote PCI continue treatment including aspirin beta-blocker high intensity statin ?Blood pressure at target continue current treatment ?Lipids are ideal we will also screen her for LP(a) excess of present we need to consider adding PCSK9 inhibitor ?Stable diabetes referred downstairs to [facility] primary care for ongoing medical care as her PCP office is closed ? ? ?Next appointment: 1 year ? ? ?Medication Adjustments/Labs and Tests Ordered: ?Current medicines are reviewed at length with the patient today.  Concerns regarding medicines are outlined above.  ?No orders of the defined types were placed in this encounter. ? ?No orders of the defined types were placed in this encounter. ? ? ?Chief Complaint  ?Patient presents with  ? Follow-up  ? Coronary Artery Disease  ? ? ?History of Present Illness:   ? ?Jennifer Ali is a 51 y.o. female with a hx of  PFO closure 10/20/2019 hypertension dyslipidemia and CAD with remote PCI and stent in 2005 following ACS to LAD and history of stroke last seen 12/27/2019.  She has been seen by pulmonary and has asthma obstructive sleep apnea and restrictive lung disease she was last seen 02/20/2021. ?She had an echocardiogram done in October 2021  following PFO closure with no residual shunt.  Ejection fraction was normal 60 to 65% and there is no valvular abnormality. ?1. S/P PFO closure; no residual shunt noted; negative saline  ?microcavitation study.  ? 2. Left ventricular ejection fraction, by estimation, is 60 to 65%. The  ?left ventricle has normal function. The left ventricle has no regional  ?wall motion abnormalities. Left ventricular diastolic parameters are  ?indeterminate.  ? 3. Right ventricular systolic function is normal. The right ventricular  ?size is normal.  ? 4. The mitral valve is normal in structure. No evidence of mitral valve  ?regurgitation. No evidence of mitral stenosis.  ? 5. The aortic valve is tricuspid. Aortic valve regurgitation is not  ?visualized. No aortic stenosis is present.  ? 6. The inferior vena cava is normal in size with greater than 50%  ?respiratory variability, suggesting right atrial pressure of 3 mmHg.  ? 7. Agitated saline contrast bubble study was negative, with no evidence  ?of any interatrial shunt.  ? ?Compliance with diet, lifestyle and medications: Yes ? ?Unfortunately her PCP closed the office Dr. Clarene [place] and referred her downstairs to [facility] primary care. ?From my perspective she continues to do well she has had closure of her PFO and takes aspirin for stroke prophylaxis ?No angina edema shortness of breath palpitation or syncope ?Past Medical History:  ?Diagnosis Date  ? Anemia   ? Borderline personality disorder (HCC) 07/09/2016  ? Coronary artery disease   ?  Dyslipidemia 07/22/2015  ? Overview:  Low HDL  ? Heart attack (HCC)   ? High cholesterol   ? Hypertension   ? Hypothyroidism   ? Ischemic stroke (HCC) 01/31/2018  ? Left leg numbness 11/02/2018  ? Left leg pain 11/02/2018  ? Major depressive disorder, recurrent episode, severe (HCC) 01/25/2019  ? Obesity   ? Old MI (myocardial infarction) 07/22/2015  ? Overview:  1. s/p MI Dec. 2005. S/P PCI and CYpher stent LAD  2. No sig. disease by  cath, June,2010  3.Stress echo negative for ischemia at 10 mets in June 2013  ? Orthostatic hypotension 07/22/2015  ? Patent foramen ovale   ? (a) by TEE 09/06/19 "small PFO with predominantly R to L shunting across the atrial septum"  ? Posttraumatic stress disorder 07/09/2016  ? ? ?Past Surgical History:  ?Procedure Laterality Date  ? ABDOMINAL HYSTERECTOMY    ? ANTERIOR CRUCIATE LIGAMENT REPAIR    ? BUBBLE STUDY  09/06/2019  ? Procedure: BUBBLE STUDY;  Surgeon: Chilton Si, MD;  Location: [facility] ENDOSCOPY;  Service: Cardiovascular;;  ? CARDIAC CATHETERIZATION    ? CORONARY ANGIOPLASTY WITH STENT PLACEMENT    ? DILATION AND CURETTAGE OF UTERUS    ? PATENT FORAMEN OVALE(PFO) CLOSURE N/A 10/20/2019  ? Procedure: PATENT FORAMEN OVALE (PFO) CLOSURE;  Surgeon: Tonny Bollman, MD;  Location: [facility] INVASIVE CV LAB;  Service: Cardiovascular;  Laterality: N/A;  ? TEE WITHOUT CARDIOVERSION N/A 09/06/2019  ? Procedure: TRANSESOPHAGEAL ECHOCARDIOGRAM (TEE);  Surgeon: Chilton Si, MD;  Location: [facility] ENDOSCOPY;  Service: Cardiovascular;  Laterality: N/A;  ? TONSILLECTOMY    ? ? ?Current Medications: ?Current Meds  ?Medication Sig  ? ALPRAZolam (XANAX) 1 MG tablet Take 1 mg by mouth every 6 (six) hours.  ? aspirin EC 81 MG tablet Take 1 tablet (81 mg total) by mouth daily. Swallow whole.  ? cyclobenzaprine (FLEXERIL) 5 MG tablet Take 5 mg by mouth every 6 (six) hours as needed. As needed  ? gabapentin (NEURONTIN) 300 MG capsule TAKE 1 CAPSULE BY MOUTH THREE TIMES DAILY  ? HYDROcodone-acetaminophen (NORCO/VICODIN) 5-325 MG tablet Take 1 tablet by mouth every 6 (six) hours as needed for moderate pain.  ? lamoTRIgine (LAMICTAL) 200 MG tablet Take 200 mg by mouth daily.  ? LATUDA 60 MG TABS Take 1 tablet by mouth daily.  ? levothyroxine (SYNTHROID) 125 MCG tablet Take 125 mcg by mouth daily.  ? metFORMIN (GLUCOPHAGE) 1000 MG tablet Take 1,000 mg by mouth 2 (two) times daily.  ? metoprolol succinate (TOPROL-XL) 100 MG 24 hr tablet  Take 100 mg by mouth daily. Take with or immediately following a meal.  ? potassium chloride SA (KLOR-CON) 20 MEQ tablet Take 1 tablet (20 mEq total) by mouth daily.  ? risperiDONE (RISPERDAL) 0.5 MG tablet Take 0.5 mg by mouth 2 (two) times daily.  ? rosuvastatin (CRESTOR) 10 MG tablet Take 1 tablet (10 mg total) by mouth daily.  ? zolpidem (AMBIEN) 5 MG tablet Take 5 mg by mouth at bedtime.  ?  ? ?Allergies:   Sulfa antibiotics  ? ?Social History  ? ?Socioeconomic History  ? Marital status: Married  ?  Spouse name: Not on file  ? Number of children: Not on file  ? Years of education: Not on file  ? Highest education level: Not on file  ?Occupational History  ? Not on file  ?Tobacco Use  ? Smoking status: Former  ?  Packs/day: 0.50  ?  Years: 25.00  ?  Pack  years: 12.50  ?  Types: Cigarettes  ?  Quit date: 02/07/2016  ?  Years since quitting: 6.1  ? Smokeless tobacco: Never  ?Vaping Use  ? Vaping Use: Never used  ?Substance and Sexual Activity  ? Alcohol use: Not Currently  ?  Comment: occassionally  ? Drug use: Not Currently  ?  Types: Marijuana, Methamphetamines, Cocaine  ? Sexual activity: Not on file  ?Other Topics Concern  ? Not on file  ?Social History Narrative  ? Lives with mother, father and fmaily  ? Caffeine use: soda/tea daily  ? Right handed  ? ?Social Determinants of Health  ? ?Financial Resource Strain: Not on file  ?Food Insecurity: Not on file  ?Transportation Needs: Not on file  ?Physical Activity: Not on file  ?Stress: Not on file  ?Social Connections: Not on file  ?  ? ?Family History: ?The patient's family history includes Diabetes in her father and mother; Hyperlipidemia in her mother; Hypertension in her mother; Stroke in her mother. ?ROS:   ?Please see the history of present illness.    ?All other systems reviewed and are negative. ? ?EKGs/Labs/Other Studies Reviewed:   ? ?The following studies were reviewed today: ? ?EKG:  EKG ordered today and personally reviewed.  The ekg ordered today  demonstrates sinus rhythm normal EKG ? ?Recent Labs: ?No results found for requested labs within last 8760 hours.  ?Recent Lipid Panel ?   ?Component Value Date/Time  ? CHOL 93 (L) 12/26/2020 1057  ? TRIG 111 01/0

## 2022-04-08 ENCOUNTER — Ambulatory Visit (INDEPENDENT_AMBULATORY_CARE_PROVIDER_SITE_OTHER): Payer: Medicare Other | Admitting: Cardiology

## 2022-04-08 ENCOUNTER — Encounter: Payer: Self-pay | Admitting: Cardiology

## 2022-04-08 VITALS — BP 116/70 | HR 64 | Ht 68.0 in | Wt 199.0 lb

## 2022-04-08 DIAGNOSIS — I2510 Atherosclerotic heart disease of native coronary artery without angina pectoris: Secondary | ICD-10-CM | POA: Diagnosis not present

## 2022-04-08 DIAGNOSIS — I10 Essential (primary) hypertension: Secondary | ICD-10-CM | POA: Diagnosis not present

## 2022-04-08 DIAGNOSIS — Z8774 Personal history of (corrected) congenital malformations of heart and circulatory system: Secondary | ICD-10-CM

## 2022-04-08 DIAGNOSIS — E119 Type 2 diabetes mellitus without complications: Secondary | ICD-10-CM

## 2022-04-08 DIAGNOSIS — Q2112 Patent foramen ovale: Secondary | ICD-10-CM

## 2022-04-08 DIAGNOSIS — E785 Hyperlipidemia, unspecified: Secondary | ICD-10-CM

## 2022-04-08 NOTE — Patient Instructions (Signed)
Medication Instructions:  ?Your physician recommends that you continue on your current medications as directed. Please refer to the Current Medication list given to you today. ? ?*If you need a refill on your cardiac medications before your next appointment, please call your pharmacy* ? ? ?Lab Work: ?Your physician recommends that you return for lab work in:  ? ?Labs today in [address]: CMP, Lipid, Lpa, CBC ? ?If you have labs (blood work) drawn today and your tests are completely normal, you will receive your results only by: ?MyChart Message (if you have MyChart) OR ?A paper copy in the mail ?If you have any lab test that is abnormal or we need to change your treatment, we will call you to review the results. ? ? ?Testing/Procedures: ?None ? ? ?Follow-Up: ?At [facility], you and your health needs are our priority.  As part of our continuing mission to provide you with exceptional heart care, we have created designated Provider Care Teams.  These Care Teams include your primary Cardiologist (physician) and Advanced Practice Providers (APPs -  Physician Assistants and Nurse Practitioners) who all work together to provide you with the care you need, when you need it. ? ?We recommend signing up for the patient portal called "MyChart".  Sign up information is provided on this After Visit Summary.  MyChart is used to connect with patients for Virtual Visits (Telemedicine).  Patients are able to view lab/test results, encounter notes, upcoming appointments, etc.  Non-urgent messages can be sent to your provider as well.   ?To learn more about what you can do with MyChart, go to [email or web address].   ? ?Your next appointment:   ?1 year(s) ? ?The format for your next appointment:   ?In Person ? ?Provider:   ?Norman Herrlich, MD  ? ? ?Other Instructions ?None ? ?Important Information About Sugar ? ? ? ? ? ? ?

## 2022-04-08 NOTE — Addendum Note (Signed)
Addended by: Roxanne Mins I on: 04/08/2022 10:22 AM ? ? Modules accepted: Orders ? ?

## 2022-04-09 LAB — CBC
Hematocrit: 46 % (ref 34.0–46.6)
Hemoglobin: 15.3 g/dL (ref 11.1–15.9)
MCH: 29.5 pg (ref 26.6–33.0)
MCHC: 33.3 g/dL (ref 31.5–35.7)
MCV: 89 fL (ref 79–97)
Platelets: 315 10*3/uL (ref 150–450)
RBC: 5.19 x10E6/uL (ref 3.77–5.28)
RDW: 13.2 % (ref 11.7–15.4)
WBC: 8.6 10*3/uL (ref 3.4–10.8)

## 2022-04-09 LAB — COMPREHENSIVE METABOLIC PANEL
ALT: 15 IU/L (ref 0–32)
AST: 19 IU/L (ref 0–40)
Albumin/Globulin Ratio: 2.2 (ref 1.2–2.2)
Albumin: 5.3 g/dL — ABNORMAL HIGH (ref 3.8–4.9)
Alkaline Phosphatase: 52 IU/L (ref 44–121)
BUN/Creatinine Ratio: 11 (ref 9–23)
BUN: 11 mg/dL (ref 6–24)
Bilirubin Total: 0.4 mg/dL (ref 0.0–1.2)
CO2: 21 mmol/L (ref 20–29)
Calcium: 10.2 mg/dL (ref 8.7–10.2)
Chloride: 103 mmol/L (ref 96–106)
Creatinine, Ser: 1.02 mg/dL — ABNORMAL HIGH (ref 0.57–1.00)
Globulin, Total: 2.4 g/dL (ref 1.5–4.5)
Glucose: 92 mg/dL (ref 70–99)
Potassium: 4.5 mmol/L (ref 3.5–5.2)
Sodium: 143 mmol/L (ref 134–144)
Total Protein: 7.7 g/dL (ref 6.0–8.5)
eGFR: 67 mL/min/{1.73_m2} (ref >59)

## 2022-04-09 LAB — LIPID PANEL
Chol/HDL Ratio: 2.8 ratio (ref 0.0–4.4)
Cholesterol, Total: 119 mg/dL (ref 100–199)
HDL: 43 mg/dL (ref >39)
LDL Chol Calc (NIH): 46 mg/dL (ref 0–99)
Triglycerides: 183 mg/dL — ABNORMAL HIGH (ref 0–149)
VLDL Cholesterol Cal: 30 mg/dL (ref 5–40)

## 2022-04-09 LAB — LIPOPROTEIN A (LPA): Lipoprotein (a): 30.6 nmol/L (ref <75.0)

## 2022-04-29 ENCOUNTER — Other Ambulatory Visit: Payer: Self-pay | Admitting: Neurology

## 2022-04-29 ENCOUNTER — Other Ambulatory Visit: Payer: Self-pay | Admitting: Cardiology

## 2022-04-29 DIAGNOSIS — I10 Essential (primary) hypertension: Secondary | ICD-10-CM

## 2022-07-28 ENCOUNTER — Other Ambulatory Visit: Payer: Self-pay | Admitting: Cardiology

## 2022-08-01 ENCOUNTER — Other Ambulatory Visit: Payer: Self-pay | Admitting: Cardiology

## 2022-08-01 DIAGNOSIS — I10 Essential (primary) hypertension: Secondary | ICD-10-CM

## 2022-10-07 ENCOUNTER — Ambulatory Visit: Payer: Medicare Other | Admitting: Neurology

## 2023-02-02 ENCOUNTER — Other Ambulatory Visit: Payer: Self-pay | Admitting: Cardiology

## 2023-02-02 DIAGNOSIS — I10 Essential (primary) hypertension: Secondary | ICD-10-CM

## 2023-02-02 NOTE — Telephone Encounter (Signed)
Rx refill sent to pharmacy. 

## 2023-04-30 ENCOUNTER — Other Ambulatory Visit: Payer: Self-pay | Admitting: Cardiology

## 2023-04-30 DIAGNOSIS — I10 Essential (primary) hypertension: Secondary | ICD-10-CM
